# Patient Record
Sex: Male | Born: 1955 | Race: White | Hispanic: No | Smoking: Former smoker
Health system: Southern US, Community
[De-identification: ages and names within clinical notes are randomized; demographics above are authoritative.]

## PROBLEM LIST (undated history)

## (undated) DIAGNOSIS — T782XXA Anaphylactic shock, unspecified, initial encounter: Secondary | ICD-10-CM

## (undated) DIAGNOSIS — Z471 Aftercare following joint replacement surgery: Secondary | ICD-10-CM

## (undated) DIAGNOSIS — Z96641 Presence of right artificial hip joint: Secondary | ICD-10-CM

## (undated) DIAGNOSIS — T7840XA Allergy, unspecified, initial encounter: Secondary | ICD-10-CM

## (undated) DIAGNOSIS — Z8489 Family history of other specified conditions: Secondary | ICD-10-CM

## (undated) DIAGNOSIS — M1990 Unspecified osteoarthritis, unspecified site: Secondary | ICD-10-CM

## (undated) HISTORY — DX: Aftercare following joint replacement surgery: Z96.641

## (undated) HISTORY — DX: Aftercare following joint replacement surgery: Z47.1

## (undated) HISTORY — DX: Anaphylactic shock, unspecified, initial encounter: T78.2XXA

## (undated) HISTORY — DX: Allergy, unspecified, initial encounter: T78.40XA

---
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
## 2014-10-31 ENCOUNTER — Ambulatory Visit: Payer: Self-pay | Attending: Internal Medicine

## 2019-08-11 ENCOUNTER — Encounter: Payer: Self-pay | Admitting: Nurse Practitioner

## 2019-08-18 ENCOUNTER — Encounter: Payer: Self-pay | Admitting: Nurse Practitioner

## 2019-08-18 ENCOUNTER — Ambulatory Visit (INDEPENDENT_AMBULATORY_CARE_PROVIDER_SITE_OTHER): Payer: BC Managed Care – PPO | Admitting: Nurse Practitioner

## 2019-08-18 ENCOUNTER — Other Ambulatory Visit: Payer: Self-pay

## 2019-08-18 ENCOUNTER — Encounter (INDEPENDENT_AMBULATORY_CARE_PROVIDER_SITE_OTHER): Payer: Self-pay

## 2019-08-18 VITALS — BP 138/78 | HR 81 | Temp 98.6°F | Resp 20 | Ht 78.0 in | Wt 211.6 lb

## 2019-08-18 DIAGNOSIS — H9192 Unspecified hearing loss, left ear: Secondary | ICD-10-CM

## 2019-08-18 DIAGNOSIS — R350 Frequency of micturition: Secondary | ICD-10-CM

## 2019-08-18 DIAGNOSIS — Z13220 Encounter for screening for lipoid disorders: Secondary | ICD-10-CM | POA: Diagnosis not present

## 2019-08-18 DIAGNOSIS — M7061 Trochanteric bursitis, right hip: Secondary | ICD-10-CM

## 2019-08-18 DIAGNOSIS — Z23 Encounter for immunization: Secondary | ICD-10-CM

## 2019-08-18 DIAGNOSIS — Z91018 Allergy to other foods: Secondary | ICD-10-CM

## 2019-08-18 MED ORDER — EPINEPHRINE 0.3 MG/0.3ML IJ SOAJ: 0.3000 mg | INTRAMUSCULAR | 1 refills | Status: DC | PRN

## 2019-08-18 NOTE — Patient Instructions (Signed)
Stop goodies powder  Start naproxen 220 mg 1 tablet twice daily for 1 week- can use up to 2 if needed To use ice to effected area 3 times daily To use muscle rub AFTER ice   If symptoms fail to improve after 1 week or worsen to notify we will refer to orthopedics       Hip Bursitis  Hip bursitis is inflammation of a fluid-filled sac (bursa) in the hip joint. The bursa prevents the bones in the hip joint from rubbing against each other. Hip bursitis can cause mild to moderate pain, and symptoms often come and go over time. What are the causes? This condition may be caused by:  Injury to the hip.  Overuse of the muscles that surround the hip joint.  Previous injury or surgery of the hip.  Arthritis or gout.  Diabetes.  Thyroid disease.  Infection. In some cases, the cause may not be known. What are the signs or symptoms? Symptoms of this condition include:  Mild or moderate pain in the hip area. Pain may get worse with movement.  Tenderness and swelling of the hip, especially on the outer side of the hip.  In rare cases, the bursa may become infected. This may cause a fever, as well as warmth and redness in the area. Symptoms may come and go. How is this diagnosed? This condition may be diagnosed based on:  A physical exam.  Your medical history.  X-rays.  Removal of fluid from your inflamed bursa for testing (biopsy). You may be sent to a health care provider who specializes in bone diseases (orthopedist) or a provider who specializes in joint inflammation (rheumatologist). How is this treated? This condition is treated by resting, icing, applying pressure (compression), and raising (elevating) the injured area. This is called RICE treatment. In some cases, this may be enough to make your symptoms go away. Treatment may also include:  Using crutches.  Draining fluid out of the bursa to help relieve swelling.  Injecting medicine that helps to reduce  inflammation (cortisone).  Additional medicines if the bursa is infected. Follow these instructions at home: Managing pain, stiffness, and swelling   If directed, put ice on the painful area. ? Put ice in a plastic bag. ? Place a towel between your skin and the bag. ? Leave the ice on for 20 minutes, 2-3 times a day. ? Raise (elevate) your hip above the level of your heart as much as you can without pain. To do this, try putting a pillow under your hips while you lie down. Activity  Return to your normal activities as told by your health care provider. Ask your health care provider what activities are safe for you.  Rest and protect your hip as much as possible until your pain and swelling get better. General instructions  Take over-the-counter and prescription medicines only as told by your health care provider.  Wear compression wraps only as told by your health care provider.  Do not use your hip to support your body weight until your health care provider says that you can. Use crutches as told by your health care provider.  Gently massage and stretch your injured area as often as is comfortable.  Keep all follow-up visits as told by your health care provider. This is important. How is this prevented?  Exercise regularly, as told by your health care provider.  Warm up and stretch before being active.  Cool down and stretch after being active.  If  an activity irritates your hip or causes pain, avoid the activity as much as possible.  Avoid sitting down for long periods at a time. Contact a health care provider if you:  Have a fever.  Develop new symptoms.  Have difficulty walking or doing everyday activities.  Have pain that gets worse or does not get better with medicine.  Develop red skin or a feeling of warmth in your hip area. Get help right away if you:  Cannot move your hip.  Have severe pain. Summary  Hip bursitis is inflammation of a fluid-filled sac  (bursa) in the hip joint.  Hip bursitis can cause mild to moderate pain, and symptoms often come and go over time.  This condition is treated with rest, ice, compression, elevation, and medicines. This information is not intended to replace advice given to you by your health care provider. Make sure you discuss any questions you have with your health care provider. Document Released: 04/17/2002 Document Revised: 07/04/2018 Document Reviewed: 07/04/2018 Elsevier Patient Education  2020 [employer].

## 2019-08-18 NOTE — Progress Notes (Signed)
Careteam: Patient Care Team: Patient, No Pcp Per as PCP - General (General Practice)  Advanced Directive information    Allergies  Allergen Reactions  . Beef-Derived Products Anaphylaxis  . Other     Peanut Butter, Anaphylaxis     Chief Complaint  Patient presents with  . New Patient (Initial Visit)     HPI: Patient is a 63 y.o. male seen in the office today to establish care. Has not seen an provider in years. 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  His Dad was previous pt of Dr Mariea Clonts.   Had anaphylaxis after drinking a beer, having a pack of nabs and jerky. He went to the hospital and later testing came back that he was allergic to all 3. He was told to stay away from beef and peanut butter. 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   Has pain in right hip- takes goody's powder 1-2  A day- getting worse over the last 3 months. Catching pain to a constant ache. Can not sit for long or the pain worsening. 5-6/10     Review of Systems:  Review of Systems  Constitutional: Negative for chills, fever and weight loss.  HENT: Positive for tinnitus (left ear ).   Respiratory: Negative for cough, sputum production and shortness of breath.   Cardiovascular: Negative for chest pain, palpitations and leg swelling.  Gastrointestinal: Negative for abdominal pain, constipation, diarrhea and heartburn.  Genitourinary: Negative for dysuria, frequency and urgency.  Musculoskeletal: Positive for joint pain (right). Negative for back pain, falls and myalgias.  Skin: Negative.   Neurological: Negative for dizziness and headaches.  Psychiatric/Behavioral: Negative for depression and memory loss. The patient does not have insomnia.     Past Medical History:  Diagnosis Date  . Anaphylaxis    Per [facility] Patient Packet    Past Surgical History:  Procedure Laterality Date  . APPENDECTOMY  1966    Per [facility] New Patient Packet    Social History:   reports that he has quit smoking. His smoking use included cigarettes. He has a 45.00 pack-year smoking history. He has never used smokeless tobacco. He reports current alcohol use. No history on file for drug.  Family History  Problem Relation Age of Onset  . Stroke Mother     Medications: Patient's Medications  New Prescriptions   No medications on file  Previous Medications   ASPIRIN-ACETAMINOPHEN-CAFFEINE (GOODYS EXTRA STRENGTH) 500-325-65 MG PACK    Take 1-2 packets by mouth daily.  Modified Medications   No medications on file  Discontinued Medications   No medications on file    Physical Exam:  Vitals:   08/18/19 1338  BP: 138/78  Pulse: 81  Resp: 20  Temp: 98.6 F (37 C)  SpO2: 98%  Weight: 211 lb 9.6 oz (96 kg)  Height: _0  (1.981 m)   Body mass index is 24.45 kg/m. Wt Readings from Last 3 Encounters:  08/18/19 211 lb 9.6 oz (96 kg)    Physical Exam Constitutional:      General: He is not in acute distress.    Appearance: He is well-developed. He is not diaphoretic.  HENT:     Head: Normocephalic and atraumatic.     Mouth/Throat:     Pharynx: No oropharyngeal exudate.  Eyes:     Conjunctiva/sclera: Conjunctivae normal.  Pupils: Pupils are equal, round, and reactive to light.  Neck:     Musculoskeletal: Normal range of motion and neck supple.  Cardiovascular:     Rate and Rhythm: Normal rate and regular rhythm.     Heart sounds: Normal heart sounds.  Pulmonary:     Effort: Pulmonary effort is normal.     Breath sounds: Normal breath sounds.  Abdominal:     General: Bowel sounds are normal.     Palpations: Abdomen is soft.  Musculoskeletal:     Right hip: He exhibits decreased range of motion, decreased strength and tenderness.       Legs:  Skin:    General: Skin is warm and dry.  Neurological:     Mental Status: He is alert and oriented to person, place, and time.      Labs reviewed: Basic Metabolic Panel: No results for input(s): NA, K, CL, CO2, GLUCOSE, BUN, CREATININE, CALCIUM, MG, PHOS, TSH in the last 8760 hours. Liver Function Tests: No results for input(s): AST, ALT, ALKPHOS, BILITOT, PROT, ALBUMIN in the last 8760 hours. No results for input(s): LIPASE, AMYLASE in the last 8760 hours. No results for input(s): AMMONIA in the last 8760 hours. CBC: No results for input(s): WBC, NEUTROABS, HGB, HCT, MCV, PLT in the last 8760 hours. Lipid Panel: No results for input(s): CHOL, HDL, LDLCALC, TRIG, CHOLHDL, LDLDIRECT in the last 8760 hours. TSH: No results for input(s): TSH in the last 8760 hours. A1C: No results found for: HGBA1C   Assessment/Plan 1. Need for influenza vaccination - Flu Vaccine QUAD 6+ mos PF IM (Fluarix Quad PF)  2. Hearing loss of left ear, unspecified hearing loss type -ongoing, worked with heavy machines for a long time causing ringing in the ears, may want to look into hearing aids in the future but not at this time.  3. Urinary frequency -ongoing, father required TURP - PSA  4. Trochanteric bursitis of right hip Stop goodies powder  Start naproxen 220 mg 1 tablet twice daily for 1 week- can use up to 2 if needed To use ice to effected area 3 times daily To use muscle rub AFTER ice  -if fails to improve consider ortho referral for imagining/injection. - CMP with eGFR([facility])  5. Screening for lipid disorders -no diet modifications at this time - Lipid Panel - CBC with Differential/Platelet  6. Allergy to alpha-gal - EPINEPHrine 0.3 mg/0.3 mL IJ SOAJ injection; Inject 0.3 mLs (0.3 mg total) into the muscle as needed for anaphylaxis.  Dispense: 1 each; Refill: 1  Aletta Edmunds K. [place], [facility] [phone]

## 2019-08-19 LAB — LIPID PANEL
Cholesterol: 200 mg/dL — ABNORMAL HIGH (ref <200)
HDL: 37 mg/dL — ABNORMAL LOW (ref >=40)
LDL Cholesterol (Calc): 123 mg/dL (calc) — ABNORMAL HIGH
Non-HDL Cholesterol (Calc): 163 mg/dL (calc) — ABNORMAL HIGH (ref <130)
Total CHOL/HDL Ratio: 5.4 (calc) — ABNORMAL HIGH (ref <5.0)
Triglycerides: 260 mg/dL — ABNORMAL HIGH (ref <150)

## 2019-08-19 LAB — COMPLETE METABOLIC PANEL WITH GFR
AG Ratio: 1.5 (calc) (ref 1.0–2.5)
ALT: 13 U/L (ref 9–46)
AST: 13 U/L (ref 10–35)
Albumin: 4.1 g/dL (ref 3.6–5.1)
Alkaline phosphatase (APISO): 101 U/L (ref 35–144)
BUN/Creatinine Ratio: 15 (calc) (ref 6–22)
BUN: 19 mg/dL (ref 7–25)
CO2: 25 mmol/L (ref 20–32)
Calcium: 9.2 mg/dL (ref 8.6–10.3)
Chloride: 107 mmol/L (ref 98–110)
Creat: 1.29 mg/dL — ABNORMAL HIGH (ref 0.70–1.25)
GFR, Est African American: 67 mL/min/{1.73_m2} (ref >=60)
GFR, Est Non African American: 58 mL/min/{1.73_m2} — ABNORMAL LOW (ref >=60)
Globulin: 2.7 g/dL (calc) (ref 1.9–3.7)
Glucose, Bld: 87 mg/dL (ref 65–139)
Potassium: 4.3 mmol/L (ref 3.5–5.3)
Sodium: 139 mmol/L (ref 135–146)
Total Bilirubin: 0.5 mg/dL (ref 0.2–1.2)
Total Protein: 6.8 g/dL (ref 6.1–8.1)

## 2019-08-19 LAB — CBC WITH DIFFERENTIAL/PLATELET
Absolute Monocytes: 662 cells/uL (ref 200–950)
Basophils Absolute: 62 cells/uL (ref 0–200)
Basophils Relative: 0.9 %
Eosinophils Absolute: 228 cells/uL (ref 15–500)
Eosinophils Relative: 3.3 %
HCT: 40.8 % (ref 38.5–50.0)
Hemoglobin: 13.6 g/dL (ref 13.2–17.1)
Lymphs Abs: 1311 cells/uL (ref 850–3900)
MCH: 29.2 pg (ref 27.0–33.0)
MCHC: 33.3 g/dL (ref 32.0–36.0)
MCV: 87.6 fL (ref 80.0–100.0)
MPV: 12.7 fL — ABNORMAL HIGH (ref 7.5–12.5)
Monocytes Relative: 9.6 %
Neutro Abs: 4637 cells/uL (ref 1500–7800)
Neutrophils Relative %: 67.2 %
Platelets: 214 10*3/uL (ref 140–400)
RBC: 4.66 10*6/uL (ref 4.20–5.80)
RDW: 13.3 % (ref 11.0–15.0)
Total Lymphocyte: 19 %
WBC: 6.9 10*3/uL (ref 3.8–10.8)

## 2019-08-19 LAB — PSA: PSA: 3.3 ng/mL (ref <=4.0)

## 2019-08-22 ENCOUNTER — Other Ambulatory Visit: Payer: Self-pay

## 2019-08-22 DIAGNOSIS — E7800 Pure hypercholesterolemia, unspecified: Secondary | ICD-10-CM

## 2019-10-04 ENCOUNTER — Telehealth: Payer: Self-pay | Admitting: Nurse Practitioner

## 2019-10-04 ENCOUNTER — Other Ambulatory Visit: Payer: Self-pay | Admitting: Nurse Practitioner

## 2019-10-04 DIAGNOSIS — M7061 Trochanteric bursitis, right hip: Secondary | ICD-10-CM

## 2019-10-04 NOTE — Telephone Encounter (Signed)
Pt called to say that Aleve & ice is no longer helping & is ready to move to next step.  Xray? Ortho?  Please advise Thanks, Lattie Haw

## 2019-10-04 NOTE — Telephone Encounter (Signed)
Ortho referral made 

## 2019-10-04 NOTE — Telephone Encounter (Signed)
Spoke with patient and advised that referral has been made.

## 2019-10-13 DIAGNOSIS — H52223 Regular astigmatism, bilateral: Secondary | ICD-10-CM | POA: Diagnosis not present

## 2019-10-17 ENCOUNTER — Other Ambulatory Visit: Payer: Self-pay

## 2019-10-17 ENCOUNTER — Ambulatory Visit: Payer: BC Managed Care – PPO

## 2019-10-17 ENCOUNTER — Ambulatory Visit: Payer: Self-pay

## 2019-10-17 ENCOUNTER — Ambulatory Visit: Payer: BC Managed Care – PPO | Admitting: Orthopaedic Surgery

## 2019-10-17 DIAGNOSIS — M25551 Pain in right hip: Secondary | ICD-10-CM

## 2019-10-17 DIAGNOSIS — M25511 Pain in right shoulder: Secondary | ICD-10-CM | POA: Diagnosis not present

## 2019-10-17 DIAGNOSIS — M1611 Unilateral primary osteoarthritis, right hip: Secondary | ICD-10-CM | POA: Diagnosis not present

## 2019-10-17 NOTE — Progress Notes (Signed)
Office Visit Note   Patient: Zachary Best           Date of Birth: August 15, 1956           MRN: [id number] Visit Date: 10/17/2019              Requested by: [name], NP [address] [place]. [place],  [place] [address] PCP: [name], NP   Assessment & Plan: Visit Diagnoses:  1. Pain in right hip   2. Right shoulder pain, unspecified chronicity   3. Unilateral primary osteoarthritis, right hip     Plan: I went over in detail the patient's clinical exam findings with his signs and symptoms as well as x-ray findings.  From a hip standpoint on the right side my recommendation be hip replacement surgery for when this becomes definitely affecting his mobility, his actives daily living and his quality of life.  There is no joint space left so an intra-articular injection would not be useful or helpful at this standpoint.  He can take anti-inflammatories as needed for pain and occasionally offload that hip with a cane in his opposite hand.  He does have our surgery scheduler's card.  I gave him a hand about hip replacement surgery showed him a hip model explained in detail what the surgery involves from his interoperative and postoperative course.  I talked about the risk and benefits of surgery in detail.  For his right shoulder, would recommend an assessment by Dr. Prince Rome under ultrasound to consider an intra-articular glenohumeral joint injection and an AC joint injection.  I have also recommended nerve conduction studies on his bilateral upper extremities to rule out carpal tunnel syndrome and look for potentially a source of his numbness and tingling that may be neck related.  All question concerns were answered and addressed.  He would like to hold off on any intervention for now and will call us with questions or concerns as well as potentially the next steps if he gets to the point of wanting to go ahead and schedule hip replacement surgery.  I do feel it is medically clinically  warranted at this point based off his clinical exam and x-ray findings.  We can address other issues as he feels appropriate in terms of the right shoulder and bilateral hand.  Follow-Up Instructions: Return if symptoms worsen or fail to improve.   Orders:  Orders Placed This Encounter  Procedures  . XR HIP UNILAT W OR W/O PELVIS 1V RIGHT  . XR Shoulder Right   No orders of the defined types were placed in this encounter.     Procedures: No procedures performed   Clinical Data: No additional findings.   Subjective: Chief Complaint  Patient presents with  . Right Hip - Pain  . Right Shoulder - Pain  Patient is a very pleasant right-hand-dominant 63 year old gentleman who comes in for evaluation treatment of several things today.  He has been having right shoulder pain and a cracking and popping sensation that wakes him up at night.  Occasionally is burning into the shoulder.  He also has right hip pain and groin pain for several months now with no known injury.  He says is a pretty constant pain and hurts with weightbearing and it does wake him up at night as well.  He is not a diabetic and not a smoker.  He is otherwise healthy individual.  He does get numbness in both his hands as well.  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  HPI  Review of Systems He currently denies any headache, chest pain, shortness of breath, fever, chills, nausea, vomiting  Objective: Vital Signs: There were no vitals taken for this visit.  Physical Exam He is alert and orient x3 and in no acute distress Ortho Exam Examination of his right shoulder shows full range of motion with pain at the [facility] joint and the glenohumeral joint with slight grinding of the shoulder.  His rotator cuff is 5 out of 5 strength in all planes.  His liftoff is negative.  He does have stiffness with internal rotation combined with adduction.  Examination of his right hip shows significant stiffness  with attempts of internal and external rotation with severe pain in the groin.  There is some pain of the trochanteric area.  His left hip exam is entirely normal with fluid and full range of motion.  It is difficult for him to cross his right leg and put on his right shoe and socks.  Both hands have arthritic changes at the IP joints.  He has weak grip and pinch strength bilaterally and a positive Phalen's and Tinel's exam. Specialty Comments:  No specialty comments available.  Imaging: Xr Hip Unilat W Or W/o Pelvis 1v Right  Result Date: 10/17/2019 An AP pelvis and lateral right hip show severe end-stage arthritis of the right hip.  The left hip appears normal.  The right hip has complete loss of superior lateral joint space.  There are sclerotic and cystic changes as well as periarticular osteophytes.  Xr Shoulder Right  Result Date: 10/17/2019 3 views of the right shoulder show no acute findings.  There is significant arthritis of the [facility] joint and moderate arthritis of the glenohumeral joint.  There is a slight calcification of the insertion of the rotator cuff.    PMFS History: Patient Active Problem List   Diagnosis Date Noted  . Unilateral primary osteoarthritis, right hip 10/17/2019  . 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 Allergy to alpha-gal 08/18/2019   Past Medical History:  Diagnosis Date  . Allergy    seasonal  . Anaphylaxis    Per [facility] New Patient Packet     Family History  Problem Relation Age of Onset  . Stroke Mother   . Diabetes Mother        borderline  . Alzheimer's disease Father   . Cancer Sister     Past Surgical History:  Procedure Laterality Date  . APPENDECTOMY  1966   Per [facility] Patient Packet    Social History   Occupational History  . Not on file  Tobacco Use  . Smoking status: Former Smoker    Packs/day: 1.00    Years: 45.00    Pack years: 45.00    Types: Cigarettes    Quit date: 2005    Years since  quitting: 15.9  . Smokeless tobacco: Never Used  Substance and Sexual Activity  . Alcohol use: Yes    Comment: 1-2 drinks weekly - liquor  . Drug use: Not on file  . Sexual activity: Not on file

## 2019-11-22 ENCOUNTER — Other Ambulatory Visit: Payer: Self-pay

## 2019-11-29 ENCOUNTER — Other Ambulatory Visit: Payer: Self-pay

## 2019-11-29 ENCOUNTER — Other Ambulatory Visit: Payer: BC Managed Care – PPO

## 2019-11-29 DIAGNOSIS — E7800 Pure hypercholesterolemia, unspecified: Secondary | ICD-10-CM | POA: Diagnosis not present

## 2019-11-29 LAB — LIPID PANEL
Cholesterol: 199 mg/dL (ref <200)
HDL: 41 mg/dL (ref >=40)
LDL Cholesterol (Calc): 128 mg/dL (calc) — ABNORMAL HIGH
Non-HDL Cholesterol (Calc): 158 mg/dL (calc) — ABNORMAL HIGH (ref <130)
Total CHOL/HDL Ratio: 4.9 (calc) (ref <5.0)
Triglycerides: 186 mg/dL — ABNORMAL HIGH (ref <150)

## 2019-12-04 ENCOUNTER — Ambulatory Visit (INDEPENDENT_AMBULATORY_CARE_PROVIDER_SITE_OTHER): Payer: BC Managed Care – PPO | Admitting: Nurse Practitioner

## 2019-12-04 ENCOUNTER — Encounter: Payer: Self-pay | Admitting: Nurse Practitioner

## 2019-12-04 ENCOUNTER — Other Ambulatory Visit: Payer: Self-pay

## 2019-12-04 DIAGNOSIS — E785 Hyperlipidemia, unspecified: Secondary | ICD-10-CM | POA: Diagnosis not present

## 2019-12-04 DIAGNOSIS — M1611 Unilateral primary osteoarthritis, right hip: Secondary | ICD-10-CM

## 2019-12-04 NOTE — Progress Notes (Signed)
This service is provided via telemedicine  No vital signs collected/recorded due to the encounter was a telemedicine visit.   Location of patient (ex: home, work):  Home   Patient consents to a telephone visit:  Yes   Location of the provider (ex: office, home):  [facility], Office   Name of any referring provider: N/A  Names of all persons participating in the telemedicine service and their role in the encounter:  S.Chrae B/CMA, Sherrie Mustache, NP, and Patient   Time spent on call:  5 min with medical assistant      Careteam: Patient Care Team: Lauree Chandler, NP as PCP - General (Geriatric Medicine)  Advanced Directive information Does Patient Have a Medical Advance Directive?: No, Does patient want to make changes to medical advance directive?: Yes (MAU/Ambulatory/Procedural Areas - Information given)  Allergies  Allergen Reactions  . Beef-Derived Products Anaphylaxis  . Other     Peanut Butter, Anaphylaxis   . Penicillins Other (See Comments)    "childhood allergy", he doesn't know reaction     Chief Complaint  Patient presents with  . Follow-up    Discuss labs. Telehealth.   . Quality Metric Gaps    Discusss need for colonoscopy   . Best Practice Recommendations    Discuss need for HIV and Hep C screening   . Immunizations    Dicuss need for TD/TDaP      HPI: Patient is a 64 y.o. male  To discuss lipids. He had elevated cholesterol 3 months ago and made dietary modifications, not enough time to exercise but very active on the job.  Triglycerides decreased from 260 to 186 LDL 128 up from 123  Reports he continues to eat at night. Feels like he needs stop eating at night and then falling asleep. Feels like there is room to improve his diet  Had referral for ortho. Recommended total hip replacement. Plans to schedule surgery. 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 Will have down time after surgery.   Reports he is overweight and looking to change  lifestyle.    Review of Systems:  Review of Systems  Cardiovascular: Negative for chest pain.  Musculoskeletal: Positive for joint pain and myalgias.  Neurological: Negative for dizziness and headaches.    Past Medical History:  Diagnosis Date  . Allergy    seasonal  . Anaphylaxis    Per [facility] Patient Packet    Past Surgical History:  Procedure Laterality Date  . APPENDECTOMY  1966   Per [employer] [facility] New Patient Packet    Social History:   reports that he quit smoking about 16 years ago. His smoking use included cigarettes. He has a 45.00 pack-year smoking history. He has never used smokeless tobacco. He reports current alcohol use. He reports that he does not use drugs.  Family History  Problem Relation Age of Onset  . Stroke Mother   . Diabetes Mother        borderline  . Alzheimer's disease Father   . Cancer Sister     Medications: Patient's Medications  New Prescriptions   No medications on file  Previous Medications   ASPIRIN-ACETAMINOPHEN-CAFFEINE (GOODYS EXTRA STRENGTH) 500-325-65 MG PACK    Take 1 packet by mouth daily.    EPINEPHRINE 0.3 MG/0.3 ML IJ SOAJ INJECTION    Inject 0.3 mLs (0.3 mg total) into the muscle as needed for anaphylaxis.   NAPROXEN SODIUM (ALEVE) 220 MG TABLET    Take 220 mg by  mouth as needed.  Modified Medications   No medications on file  Discontinued Medications   No medications on file    Physical Exam:  There were no vitals filed for this visit. There is no height or weight on file to calculate BMI. Wt Readings from Last 3 Encounters:  08/18/19 211 lb 9.6 oz (96 kg)      Labs reviewed: Basic Metabolic Panel: Recent Labs    08/18/19 1431  NA 139  K 4.3  CL 107  CO2 25  GLUCOSE 87  BUN 19  CREATININE 1.29*  CALCIUM 9.2   Liver Function Tests: Recent Labs    08/18/19 1431  AST 13  ALT 13  BILITOT 0.5  PROT 6.8   No results for input(s): LIPASE, AMYLASE in the last 8760 hours. No  results for input(s): AMMONIA in the last 8760 hours. CBC: Recent Labs    08/18/19 1431  WBC 6.9  NEUTROABS 4,637  HGB 13.6  HCT 40.8  MCV 87.6  PLT 214   Lipid Panel: Recent Labs    08/18/19 1431 11/29/19 0811  CHOL 200* 199  HDL 37* 41  LDLCALC 123* 128*  TRIG 260* 186*  CHOLHDL 5.4* 4.9   TSH: No results for input(s): TSH in the last 8760 hours. A1C: No results found for: HGBA1C   Assessment/Plan 1. Hyperlipidemia LDL goal <100 -LDL 128, pt with family hx of stroke. He does not have htn but is overweight. Discussed need for weight loss through diet and exercise which will help reduce cardiovascular risks.  -he plans to make dietary modifications to avoid medications.  - Lipid Panel; Future - COMPLETE METABOLIC PANEL WITH GFR; Future  2. Unilateral primary osteoarthritis, right hip Following with ortho- recommended to get total hip replacement which he plans to proceed.   Next appt: 6 months with labs prior Burnadette Baskett K. Biagio Borg  [facility] [phone]    Virtual Visit via Telephone Note  I connected with pt on 12/04/19 at  8:30 AM EST by telephone and verified that I am speaking with the correct person using two identifiers.  Location: Patient: home Provider: office   I discussed the limitations, risks, security and privacy concerns of performing an evaluation and management service by telephone and the availability of in person appointments. I also discussed with the patient that there may be a patient responsible charge related to this service. The patient expressed understanding and agreed to proceed.   I discussed the assessment and treatment plan with the patient. The patient was provided an opportunity to ask questions and all were answered. The patient agreed with the plan and demonstrated an understanding of the instructions.   The patient was advised to call back or seek an in-person evaluation if the symptoms  worsen or if the condition fails to improve as anticipated.  I provided 13 minutes of non-face-to-face time during this encounter.  Janene Harvey. Biagio Borg Avs printed and mailed

## 2019-12-04 NOTE — Patient Instructions (Signed)

## 2020-03-14 DIAGNOSIS — N401 Enlarged prostate with lower urinary tract symptoms: Secondary | ICD-10-CM | POA: Diagnosis not present

## 2020-03-14 DIAGNOSIS — N138 Other obstructive and reflux uropathy: Secondary | ICD-10-CM | POA: Diagnosis not present

## 2020-05-14 DIAGNOSIS — N138 Other obstructive and reflux uropathy: Secondary | ICD-10-CM | POA: Diagnosis not present

## 2020-05-14 DIAGNOSIS — N401 Enlarged prostate with lower urinary tract symptoms: Secondary | ICD-10-CM | POA: Diagnosis not present

## 2020-05-30 ENCOUNTER — Other Ambulatory Visit: Payer: Self-pay

## 2020-05-30 ENCOUNTER — Other Ambulatory Visit: Payer: BC Managed Care – PPO

## 2020-05-30 DIAGNOSIS — E785 Hyperlipidemia, unspecified: Secondary | ICD-10-CM | POA: Diagnosis not present

## 2020-05-30 LAB — COMPLETE METABOLIC PANEL WITH GFR
AG Ratio: 1.6 (calc) (ref 1.0–2.5)
ALT: 14 U/L (ref 9–46)
AST: 14 U/L (ref 10–35)
Albumin: 4.2 g/dL (ref 3.6–5.1)
Alkaline phosphatase (APISO): 98 U/L (ref 35–144)
BUN: 18 mg/dL (ref 7–25)
CO2: 22 mmol/L (ref 20–32)
Calcium: 9.2 mg/dL (ref 8.6–10.3)
Chloride: 106 mmol/L (ref 98–110)
Creat: 1.15 mg/dL (ref 0.70–1.25)
GFR, Est African American: 77 mL/min/{1.73_m2} (ref >=60)
GFR, Est Non African American: 66 mL/min/{1.73_m2} (ref >=60)
Globulin: 2.7 g/dL (calc) (ref 1.9–3.7)
Glucose, Bld: 94 mg/dL (ref 65–99)
Potassium: 4.3 mmol/L (ref 3.5–5.3)
Sodium: 137 mmol/L (ref 135–146)
Total Bilirubin: 0.8 mg/dL (ref 0.2–1.2)
Total Protein: 6.9 g/dL (ref 6.1–8.1)

## 2020-05-30 LAB — LIPID PANEL
Cholesterol: 216 mg/dL — ABNORMAL HIGH (ref <200)
HDL: 41 mg/dL (ref >=40)
LDL Cholesterol (Calc): 148 mg/dL (calc) — ABNORMAL HIGH
Non-HDL Cholesterol (Calc): 175 mg/dL (calc) — ABNORMAL HIGH (ref <130)
Total CHOL/HDL Ratio: 5.3 (calc) — ABNORMAL HIGH (ref <5.0)
Triglycerides: 141 mg/dL (ref <150)

## 2020-06-03 ENCOUNTER — Other Ambulatory Visit: Payer: Self-pay

## 2020-06-03 ENCOUNTER — Encounter: Payer: Self-pay | Admitting: Nurse Practitioner

## 2020-06-03 ENCOUNTER — Ambulatory Visit (INDEPENDENT_AMBULATORY_CARE_PROVIDER_SITE_OTHER): Payer: BC Managed Care – PPO | Admitting: Nurse Practitioner

## 2020-06-03 VITALS — BP 134/82 | HR 84 | Temp 97.1°F | Ht 72.0 in | Wt 210.4 lb

## 2020-06-03 DIAGNOSIS — N401 Enlarged prostate with lower urinary tract symptoms: Secondary | ICD-10-CM

## 2020-06-03 DIAGNOSIS — Z114 Encounter for screening for human immunodeficiency virus [HIV]: Secondary | ICD-10-CM

## 2020-06-03 DIAGNOSIS — Z01818 Encounter for other preprocedural examination: Secondary | ICD-10-CM | POA: Diagnosis not present

## 2020-06-03 DIAGNOSIS — E785 Hyperlipidemia, unspecified: Secondary | ICD-10-CM | POA: Diagnosis not present

## 2020-06-03 DIAGNOSIS — M1611 Unilateral primary osteoarthritis, right hip: Secondary | ICD-10-CM | POA: Diagnosis not present

## 2020-06-03 DIAGNOSIS — Z1159 Encounter for screening for other viral diseases: Secondary | ICD-10-CM

## 2020-06-03 DIAGNOSIS — R350 Frequency of micturition: Secondary | ICD-10-CM

## 2020-06-03 NOTE — Progress Notes (Signed)
Careteam: Patient Care Team: [name], NP as PCP - General (Geriatric Medicine)  PLACE OF SERVICE:  [facility]  Advanced Directive information Does Patient Have a Medical Advance Directive?: No, Would patient like information on creating a medical advance directive?: Yes (MAU/Ambulatory/Procedural Areas - Information given)  Allergies  Allergen Reactions   Beef-Derived Products Anaphylaxis   Other     Peanut Butter, Anaphylaxis    Penicillins Other (See Comments)    "childhood allergy", he doesn't know reaction     Chief Complaint  Patient presents with   Medical Management of Chronic Issues    6 month follow-up and discuss labs (copy printed)      HPI: Patient is a 64 y.o. male for routine follow up.   Hyperlipidemia- LDL 148, does not wish to start medication despite elevation in LDL. Not eating healthy.   Osteoarthritis- recommended hip surgery by ortho, going tomorrow to scheduled surgery he will be 6-8 weeks out of work for recovery.   Overweight- not working out well- not interested in doing anything. Exercise is limited due to hip pain.   bph- followed by urology, has been started on finasteride but not on long enough to see a difference.   Gives blood every 56 days. Going today for donation.    Review of Systems:  Review of Systems  Constitutional: Negative for chills, fever and weight loss.  HENT: Negative for tinnitus.   Respiratory: Negative for cough, sputum production and shortness of breath.   Cardiovascular: Negative for chest pain, palpitations and leg swelling.  Gastrointestinal: Negative for abdominal pain, constipation, diarrhea and heartburn.  Genitourinary: Positive for frequency. Negative for dysuria and urgency.  Musculoskeletal: Positive for joint pain and myalgias. Negative for back pain and falls.  Skin: Negative.   Neurological: Negative for dizziness and headaches.  Psychiatric/Behavioral: Negative for depression and  memory loss. The patient does not have insomnia.     Past Medical History:  Diagnosis Date   Allergy    seasonal   Anaphylaxis    Per [facility] New Patient Packet    Past Surgical History:  Procedure Laterality Date   APPENDECTOMY  1966   Per [facility] New Patient Packet    Social History:   reports that he quit smoking about 16 years ago. His smoking use included cigarettes. He has a 45.00 pack-year smoking history. He has never used smokeless tobacco. He reports current alcohol use. He reports that he does not use drugs.  Family History  Problem Relation Age of Onset   Stroke Mother    Diabetes Mother        borderline   Alzheimer's disease Father    Cancer Sister     Medications: Patient's Medications  New Prescriptions   No medications on file  Previous Medications   ASPIRIN-ACETAMINOPHEN-CAFFEINE (GOODYS EXTRA STRENGTH) 500-325-65 MG PACK    Take 1 packet by mouth daily.    EPINEPHRINE 0.3 MG/0.3 ML IJ SOAJ INJECTION    Inject 0.3 mLs (0.3 mg total) into the muscle as needed for anaphylaxis.   FINASTERIDE (PROSCAR) 5 MG TABLET    Take 1 tablet by mouth daily. At dinnertime   NAPROXEN SODIUM (ALEVE) 220 MG TABLET    Take 220 mg by mouth as needed.  Modified Medications   No medications on file  Discontinued Medications   No medications on file    Physical Exam:  Vitals:   06/03/20 0834  BP: (!) 134/82  Pulse:  84  Temp: (!) 97.1 F (36.2 C)  TempSrc: Temporal  SpO2: 97%  Weight: (!) 210 lb 6.4 oz (95.4 kg)  Height: 6\' 6"  (1.981 m)   Body mass index is 24.31 kg/m. Wt Readings from Last 3 Encounters:  06/03/20 (!) 210 lb 6.4 oz (95.4 kg)  08/18/19 211 lb 9.6 oz (96 kg)    Physical Exam Constitutional:      General: He is not in acute distress.    Appearance: He is well-developed. He is not diaphoretic.  HENT:     Head: Normocephalic and atraumatic.     Mouth/Throat:     Pharynx: No oropharyngeal exudate.  Eyes:      Conjunctiva/sclera: Conjunctivae normal.     Pupils: Pupils are equal, round, and reactive to light.  Cardiovascular:     Rate and Rhythm: Normal rate and regular rhythm.     Heart sounds: Normal heart sounds.  Pulmonary:     Effort: Pulmonary effort is normal.     Breath sounds: Normal breath sounds.  Abdominal:     General: Bowel sounds are normal.     Palpations: Abdomen is soft.  Musculoskeletal:        General: No tenderness.     Cervical back: Normal range of motion and neck supple.  Skin:    General: Skin is warm and dry.  Neurological:     Mental Status: He is alert and oriented to person, place, and time.    Labs reviewed: Basic Metabolic Panel: Recent Labs    08/18/19 1431 05/30/20 0804  NA 139 137  K 4.3 4.3  CL 107 106  CO2 25 22  GLUCOSE 87 94  BUN 19 18  CREATININE 1.29* 1.15  CALCIUM 9.2 9.2   Liver Function Tests: Recent Labs    08/18/19 1431 05/30/20 0804  AST 13 14  ALT 13 14  BILITOT 0.5 0.8  PROT 6.8 6.9   No results for input(s): LIPASE, AMYLASE in the last 8760 hours. No results for input(s): AMMONIA in the last 8760 hours. CBC: Recent Labs    08/18/19 1431  WBC 6.9  NEUTROABS 4,637  HGB 13.6  HCT 40.8  MCV 87.6  PLT 214   Lipid Panel: Recent Labs    08/18/19 1431 11/29/19 0811 05/30/20 0804  CHOL 200* 199 216*  HDL 37* 41 41  LDLCALC 123* 128* 148*  TRIG 260* 186* 141  CHOLHDL 5.4* 4.9 5.3*   TSH: No results for input(s): TSH in the last 8760 hours. A1C: No results found for: HGBA1C   Assessment/Plan 1. Hyperlipidemia LDL goal <100 -LDL above goal. However he would like to work on diet and increasing activity as tolerates before adding medication.  - EKG 12-Lead SR rate 74  2. Unilateral primary osteoarthritis, right hip Severe pain, plans to have surgery. He is at avg risk for complication due to history. Likely will do well with surgery and rehab.   3. Benign prostatic hyperplasia with urinary frequency -has  started finasteride 5 mg daily but has not seen benefit.   4. Need for hepatitis C screening test Gives blood routinely and is screened  5. Encounter for screening for HIV Pt gives blood routinely and is screened  6. Pre-op evaluation -pt plans to have hip replacement due to severe pain. Pt with a hx of smoking but quit 16 years ago. No chest pains, shortness of breath or cough.  - EKG 12-Lead done today, SR rate 74.   Next appt: 3  months, labs prior  Janene Harvey. Biagio Borg  [facility] [phone]

## 2020-06-03 NOTE — Patient Instructions (Addendum)
Recommend to get TDAP at pharmacy- wait 2 weeks after COVID series complete  To get pneumonia 13 after COVID series- wait 2 weeks.   To complete cologuard  Follow up in 3 months    Heart-Healthy Eating Plan Many factors influence your heart (coronary) health, including eating and exercise habits. Coronary risk increases with abnormal blood fat (lipid) levels. Heart-healthy meal planning includes limiting unhealthy fats, increasing healthy fats, and making other diet and lifestyle changes.   What are tips for following this plan? Cooking Cook foods using methods other than frying. Baking, boiling, grilling, and broiling are all good options. Other ways to reduce fat include:  Removing the skin from poultry.  Removing all visible fats from meats.  Steaming vegetables in water or broth. Meal planning   At meals, imagine dividing your plate into fourths: ? Fill one-half of your plate with vegetables and green salads. ? Fill one-fourth of your plate with whole grains. ? Fill one-fourth of your plate with lean protein foods.  Eat 4-5 servings of vegetables per day. One serving equals 1 cup raw or cooked vegetable, or 2 cups raw leafy greens.  Eat 4-5 servings of fruit per day. One serving equals 1 medium whole fruit,  cup dried fruit,  cup fresh, frozen, or canned fruit, or  cup 100% fruit juice.  Eat more foods that contain soluble fiber. Examples include apples, broccoli, carrots, beans, peas, and barley. Aim to get 25-30 g of fiber per day.  Increase your consumption of legumes, nuts, and seeds to 4-5 servings per week. One serving of dried beans or legumes equals  cup cooked, 1 serving of nuts is  cup, and 1 serving of seeds equals 1 tablespoon. Fats  Choose healthy fats more often. Choose monounsaturated and polyunsaturated fats, such as olive and canola oils, flaxseeds, walnuts, almonds, and seeds.  Eat more omega-3 fats. Choose salmon, mackerel, sardines, tuna, flaxseed  oil, and ground flaxseeds. Aim to eat fish at least 2 times each week.  Check food labels carefully to identify foods with trans fats or high amounts of saturated fat.  Limit saturated fats. These are found in animal products, such as meats, butter, and cream. Plant sources of saturated fats include palm oil, palm kernel oil, and coconut oil.  Avoid foods with partially hydrogenated oils in them. These contain trans fats. Examples are stick margarine, some tub margarines, cookies, crackers, and other baked goods.  Avoid fried foods. General information  Eat more home-cooked food and less restaurant, buffet, and fast food.  Limit or avoid alcohol.  Limit foods that are high in starch and sugar.  Lose weight if you are overweight. Losing just 5-10% of your body weight can help your overall health and prevent diseases such as diabetes and heart disease.  Monitor your salt (sodium) intake, especially if you have high blood pressure. Talk with your health care provider about your sodium intake.  Try to incorporate more vegetarian meals weekly. What foods can I eat? Fruits All fresh, canned (in natural juice), or frozen fruits. Vegetables Fresh or frozen vegetables (raw, steamed, roasted, or grilled). Green salads. Grains Most grains. Choose whole wheat and whole grains most of the time. Rice and pasta, including brown rice and pastas made with whole wheat. Meats and other proteins Lean, well-trimmed beef, veal, pork, and lamb. Chicken and [place] without skin. All fish and shellfish. Wild duck, rabbit, pheasant, and venison. Egg whites or low-cholesterol egg substitutes. Dried beans, peas, lentils, and tofu. Seeds and  most nuts. Dairy Low-fat or nonfat cheeses, including ricotta and mozzarella. Skim or 1% milk (liquid, powdered, or evaporated). Buttermilk made with low-fat milk. Nonfat or low-fat yogurt. Fats and oils Non-hydrogenated (trans-free) margarines. Vegetable oils, including  soybean, sesame, sunflower, olive, peanut, safflower, corn, canola, and cottonseed. Salad dressings or mayonnaise made with a vegetable oil. Beverages Water (mineral or sparkling). Coffee and tea. Diet carbonated beverages. Sweets and desserts Sherbet, gelatin, and fruit ice. Small amounts of dark chocolate. Limit all sweets and desserts. Seasonings and condiments All seasonings and condiments. The items listed above may not be a complete list of foods and beverages you can eat. Contact a dietitian for more options. What foods are not recommended? Fruits Canned fruit in heavy syrup. Fruit in cream or butter sauce. Fried fruit. Limit coconut. Vegetables Vegetables cooked in cheese, cream, or butter sauce. Fried vegetables. Grains Breads made with saturated or trans fats, oils, or whole milk. Croissants. Sweet rolls. Donuts. High-fat crackers, such as cheese crackers. Meats and other proteins Fatty meats, such as hot dogs, ribs, sausage, bacon, rib-eye roast or steak. High-fat deli meats, such as salami and bologna. Caviar. Domestic duck and goose. Organ meats, such as liver. Dairy Cream, sour cream, cream cheese, and creamed cottage cheese. Whole milk cheeses. Whole or 2% milk (liquid, evaporated, or condensed). Whole buttermilk. Cream sauce or high-fat cheese sauce. Whole-milk yogurt. Fats and oils Meat fat, or shortening. Cocoa butter, hydrogenated oils, palm oil, coconut oil, palm kernel oil. Solid fats and shortenings, including bacon fat, salt pork, lard, and butter. Nondairy cream substitutes. Salad dressings with cheese or sour cream. Beverages Regular sodas and any drinks with added sugar. Sweets and desserts Frosting. Pudding. Cookies. Cakes. Pies. Milk chocolate or white chocolate. Buttered syrups. Full-fat ice cream or ice cream drinks. The items listed above may not be a complete list of foods and beverages to avoid. Contact a dietitian for more  information. Summary  Heart-healthy meal planning includes limiting unhealthy fats, increasing healthy fats, and making other diet and lifestyle changes.  Lose weight if you are overweight. Losing just 5-10% of your body weight can help your overall health and prevent diseases such as diabetes and heart disease.  Focus on eating a balance of foods, including fruits and vegetables, low-fat or nonfat dairy, lean protein, nuts and legumes, whole grains, and heart-healthy oils and fats. This information is not intended to replace advice given to you by your health care provider. Make sure you discuss any questions you have with your health care provider. Document Revised: 12/03/2017 Document Reviewed: 12/03/2017 Elsevier Patient Education  2020 [employer].

## 2020-06-04 ENCOUNTER — Ambulatory Visit: Payer: Self-pay

## 2020-06-04 ENCOUNTER — Ambulatory Visit: Payer: BC Managed Care – PPO | Admitting: Orthopaedic Surgery

## 2020-06-04 VITALS — Ht 72.0 in | Wt 210.0 lb

## 2020-06-04 DIAGNOSIS — M25551 Pain in right hip: Secondary | ICD-10-CM

## 2020-06-04 DIAGNOSIS — M1611 Unilateral primary osteoarthritis, right hip: Secondary | ICD-10-CM | POA: Diagnosis not present

## 2020-06-04 NOTE — Progress Notes (Deleted)
Office Visit Note   Patient: Zachary Best  Date of Birth: 20-Jul-1956           MRN: [id number] Visit Date: 06/04/2020              Requested by: [name], NP [address] [place]. [place],  [place] [address] PCP: [name], NP   Assessment & Plan: Visit Diagnoses:  1. Pain in right hip     Plan: ***  Follow-Up Instructions: No follow-ups on file.   Orders:  Orders Placed This Encounter  Procedures   XR HIP UNILAT W OR W/O PELVIS 1V RIGHT   No orders of the defined types were placed in this encounter.     Procedures: No procedures performed   Clinical Data: No additional findings.   Subjective: Chief Complaint  Patient  presents with   Right Hip - Pain    HPI  Review of Systems   Objective: Vital Signs: Ht 6' (1.829 m)    Wt (!) 210 lb (95.3 kg)    BMI 28.48 kg/m   Physical Exam  Ortho Exam  Specialty Comments:  No specialty comments available.  Imaging: No results found.   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   Allergy to alpha-gal 08/18/2019   Past Medical History:  Diagnosis Date   Allergy    seasonal   Anaphylaxis    Per [facility] New Patient Packet     Family History  Problem Relation Age of Onset   Stroke Mother    Diabetes Mother        borderline   Alzheimer's disease Father    Cancer Sister     Past Surgical History:  Procedure Laterality Date   APPENDECTOMY  1966   Per [facility] New Patient Packet    Social History   Occupational History   Not on file  Tobacco Use   Smoking status: Former Smoker    Packs/day: 1.00    Years: 45.00    Pack years: 45.00    Types: Cigarettes    Quit date: 2005    Years since quitting: 16.5   Smokeless tobacco: Never Used  Building services engineer Use: Never used  Substance and Sexual Activity   Alcohol use: Yes    Comment: 1-2 drinks weekly - liquor   Drug use: Never   Sexual activity: Not on file

## 2020-06-04 NOTE — Progress Notes (Signed)
Office Visit Note   Patient: Zachary Best           Date of Birth: 01-18-1956           MRN: [id number] Visit Date: 06/04/2020              Requested by: Zachary Seller, NP [address] [place]. [place],  [place] [address] PCP: Zachary Seller, NP   Assessment & Plan: Visit Diagnoses:  1. Pain in right hip   2. Unilateral primary osteoarthritis, right hip     Plan: I explained in detail what hip replacement surgery involves.  We talked about the risk and benefits of surgery.  I showed him hip implants and described how these are placed and how the interact with the bone in general.  We talked about his interoperative and postoperative course.  All questions and concerns were answered addressed.  We will work on getting this scheduled in the near future.  Follow-Up Instructions: Return for 2 weeks post-op.   Orders:  Orders Placed This Encounter  Procedures  . XR HIP UNILAT W OR W/O PELVIS 1V RIGHT   No orders of the defined types were placed in this encounter.     Procedures: No procedures performed   Clinical Data: No additional findings.   Subjective: Chief Complaint  Patient presents with  . Right Hip - Pain  The patient is well-known to me.  He has well-documented debilitating end-stage arthritis of his right hip.  This been getting worse for several years now.  I have seen him before.  Is been now hurting for over 4 to 5 years.  At this point his right hip pain is definitely affecting his activities day living, his quality of life and his mobility.  He walks with a significant limp.  His pain is daily and is 10 out of 10.  He reports significant right hip stiffness and groin pain.  This is also affecting his back and his right knee.  He has had no other acute change in medical status.  We have given him a handout before about hip replacement surgery.  He feels at this point that he has failed conservative treatment and does wish to proceed with hip replacement  surgery.  He has had no other acute changes in medical status  HPI  Review of Systems There is currently no headache, chest pain, shortness of breath, fever, chills, nausea, vomiting  Objective: Vital Signs: Ht 6' (1.829 m)   Wt (!) 210 lb (95.3 kg)   BMI 28.48 kg/m   Physical Exam He is alert and orient x3 and in no acute distress Ortho Exam Examination of his right hip shows almost essentially no range of motion of the hip.  There is severe pain in the groin with attempts of internal extra rotation with severe stiffness of the hip itself.  There is also a leg length discrepancy. Specialty Comments:  No specialty comments available.  Imaging: XR HIP UNILAT W OR W/O PELVIS 1V RIGHT  Result Date: 06/04/2020 An AP pelvis and lateral right hip show severe end-stage arthritis of the right hip.  This is worsened over the last several years.  There is complete loss of the joint space.  There is flattening of the femoral head.  There is para-articular osteophytes and sclerotic changes in the femoral head and acetabulum.    PMFS History: Patient Active Problem List   Diagnosis Date Noted  . Unilateral primary osteoarthritis, right hip 10/17/2019  .  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 Allergy to alpha-gal 08/18/2019   Past Medical History:  Diagnosis Date  . Allergy    seasonal  . Anaphylaxis    Per [facility] New Patient Packet     Family History  Problem Relation Age of Onset  . Stroke Mother   . Diabetes Mother        borderline  . Alzheimer's disease Father   . Cancer Sister     Past Surgical History:  Procedure Laterality Date  . APPENDECTOMY  1966   Per [facility] New Patient Packet    Social History   Occupational History  . Not on file  Tobacco Use  . Smoking status: Former Smoker    Packs/day: 1.00    Years: 45.00    Pack years: 45.00    Types: Cigarettes    Quit date: 2005    Years since quitting: 16.5  . Smokeless tobacco:  Never Used  Vaping Use  . Vaping Use: Never used  Substance and Sexual Activity  . Alcohol use: Yes    Comment: 1-2 drinks weekly - liquor  . Drug use: Never  . Sexual activity: Not on file

## 2020-06-18 ENCOUNTER — Other Ambulatory Visit: Payer: Self-pay

## 2020-06-18 ENCOUNTER — Telehealth: Payer: Self-pay

## 2020-06-18 ENCOUNTER — Encounter: Payer: Self-pay | Admitting: Nurse Practitioner

## 2020-06-18 ENCOUNTER — Ambulatory Visit (INDEPENDENT_AMBULATORY_CARE_PROVIDER_SITE_OTHER): Payer: Medicare Other | Admitting: Nurse Practitioner

## 2020-06-18 DIAGNOSIS — Z0000 Encounter for general adult medical examination without abnormal findings: Secondary | ICD-10-CM | POA: Diagnosis not present

## 2020-06-18 NOTE — Telephone Encounter (Signed)
Mr. mohsen, odenthal are scheduled for a virtual visit with your provider today.    Just as we do with appointments in the office, we must obtain your consent to participate.  Your consent will be active for this visit and any virtual visit you may have with one of our providers in the next 365 days.    If you have a MyChart account, I can also send a copy of this consent to you electronically.  All virtual visits are billed to your insurance company just like a traditional visit in the office.  As this is a virtual visit, video technology does not allow for your provider to perform a traditional examination.  This may limit your provider's ability to fully assess your condition.  If your provider identifies any concerns that need to be evaluated in person or the need to arrange testing such as labs, EKG, etc, we will make arrangements to do so.    Although advances in technology are sophisticated, we cannot ensure that it will always work on either your end or our end.  If the connection with a video visit is poor, we may have to switch to a telephone visit.  With either a video or telephone visit, we are not always able to ensure that we have a secure connection.   I need to obtain your verbal consent now.   Are you willing to proceed with your visit today?   Jayjay Littles has provided verbal consent on 06/18/2020 for a virtual visit (video or telephone).   Elveria Royals, CMA 06/18/2020  9:13 AM

## 2020-06-18 NOTE — Progress Notes (Signed)
This service is provided via telemedicine  No vital signs collected/recorded due to the encounter was a telemedicine visit.   Location of patient (ex: home, work):  Home  Patient consents to a telephone visit: Yes, see encounter dated 06/18/2020   Location of the provider (ex: office, home):  [facility]  Name of any referring provider:  N/A  Names of all persons participating in the telemedicine service and their role in the encounter:  Abbey Chatters, Nurse Practitioner, Elveria Royals, CMA, and patient.   Time spent on call:  8 minutes with medical assistant

## 2020-06-18 NOTE — Patient Instructions (Signed)
[name] , Thank you for taking time to come for your Medicare Wellness Visit. I appreciate your ongoing commitment to your health goals. Please review the following plan we discussed and let me know if I can assist you in the future.   Screening recommendations/referrals: Colonoscopy to complete cologuard Recommended yearly ophthalmology/optometry visit for glaucoma screening and checkup Recommended yearly dental visit for hygiene and checkup  Vaccinations: Influenza vaccine DUE- to get at office or local pharmacy Pneumococcal vaccine DUE- can get 2 weeks after COVID vaccine- can get in office Tdap vaccine DUE- to get after COVID vaccine- you will get this at your local pharmacy Shingles vaccine DUE- to get after COVID vaccine- you will get this at your local pharmacy    Advanced directives: to complete and bring back to office  Conditions/risks identified: -advanced age, smoking exposure  Next appointment: 1 year for AWV  Preventive Care 26 Years and Older, Male Preventive care refers to lifestyle choices and visits with your health care provider that can promote health and wellness. What does preventive care include?  A yearly physical exam. This is also called an annual well check.  Dental exams once or twice a year.  Routine eye exams. Ask your health care provider how often you should have your eyes checked.  Personal lifestyle choices, including:  Daily care of your teeth and gums.  Regular physical activity.  Eating a healthy diet.  Avoiding tobacco and drug use.  Limiting alcohol use.  Practicing safe sex.  Taking low doses of aspirin every day.  Taking vitamin and mineral supplements as recommended by your health care provider. What happens during an annual well check? The services and screenings done by your health care provider during your annual well check will depend on your age, overall health, lifestyle risk factors, and family history of  disease. Counseling  Your health care provider may ask you questions about your:  Alcohol use.  Tobacco use.  Drug use.  Emotional well-being.  Home and relationship well-being.  Sexual activity.  Eating habits.  History of falls.  Memory and ability to understand (cognition).  Work and work Astronomer. Screening  You may have the following tests or measurements:  Height, weight, and BMI.  Blood pressure.  Lipid and cholesterol levels. These may be checked every 5 years, or more frequently if you are over 85 years old.  Skin check.  Lung cancer screening. You may have this screening every year starting at age 45 if you have a 30-pack-year history of smoking and currently smoke or have quit within the past 15 years.  Fecal occult blood test (FOBT) of the stool. You may have this test every year starting at age 37.  Flexible sigmoidoscopy or colonoscopy. You may have a sigmoidoscopy every 5 years or a colonoscopy every 10 years starting at age 107.  Prostate cancer screening. Recommendations will vary depending on your family history and other risks.  Hepatitis C blood test.  Hepatitis B blood test.  Sexually transmitted disease (STD) testing.  Diabetes screening. This is done by checking your blood sugar (glucose) after you have not eaten for a while (fasting). You may have this done every 1-3 years.  Abdominal aortic aneurysm (AAA) screening. You may need this if you are a current or former smoker.  Osteoporosis. You may be screened starting at age 5 if you are at high risk. Talk with your health care provider about your test results, treatment options, and if necessary, the need for  more tests. Vaccines  Your health care provider may recommend certain vaccines, such as:  Influenza vaccine. This is recommended every year.  Tetanus, diphtheria, and acellular pertussis (Tdap, Td) vaccine. You may need a Td booster every 10 years.  Zoster vaccine. You may  need this after age 24.  Pneumococcal 13-valent conjugate (PCV13) vaccine. One dose is recommended after age 43.  Pneumococcal polysaccharide (PPSV23) vaccine. One dose is recommended after age 47. Talk to your health care provider about which screenings and vaccines you need and how often you need them. This information is not intended to replace advice given to you by your health care provider. Make sure you discuss any questions you have with your health care provider. Document Released: 11/22/2015 Document Revised: 07/15/2016 Document Reviewed: 08/27/2015 Elsevier Interactive Patient Education  2017 [place] Prevention in the Home Falls can cause injuries. They can happen to people of all ages. There are many things you can do to make your home safe and to help prevent falls. What can I do on the outside of my home?  Regularly fix the edges of walkways and driveways and fix any cracks.  Remove anything that might make you trip as you walk through a door, such as a raised step or threshold.  Trim any bushes or trees on the path to your home.  Use bright outdoor lighting.  Clear any walking paths of anything that might make someone trip, such as rocks or tools.  Regularly check to see if handrails are loose or broken. Make sure that both sides of any steps have handrails.  Any raised decks and porches should have guardrails on the edges.  Have any leaves, snow, or ice cleared regularly.  Use sand or salt on walking paths during winter.  Clean up any spills in your garage right away. This includes oil or grease spills. What can I do in the bathroom?  Use night lights.  Install grab bars by the toilet and in the tub and shower. Do not use towel bars as grab bars.  Use non-skid mats or decals in the tub or shower.  If you need to sit down in the shower, use a plastic, non-slip stool.  Keep the floor dry. Clean up any water that spills on the floor as soon as it  happens.  Remove soap buildup in the tub or shower regularly.  Attach bath mats securely with double-sided non-slip rug tape.  Do not have throw rugs and other things on the floor that can make you trip. What can I do in the bedroom?  Use night lights.  Make sure that you have a light by your bed that is easy to reach.  Do not use any sheets or blankets that are too big for your bed. They should not hang down onto the floor.  Have a firm chair that has side arms. You can use this for support while you get dressed.  Do not have throw rugs and other things on the floor that can make you trip. What can I do in the kitchen?  Clean up any spills right away.  Avoid walking on wet floors.  Keep items that you use a lot in easy-to-reach places.  If you need to reach something above you, use a strong step stool that has a grab bar.  Keep electrical cords out of the way.  Do not use floor polish or wax that makes floors slippery. If you must use wax, use non-skid  floor wax.  Do not have throw rugs and other things on the floor that can make you trip. What can I do with my stairs?  Do not leave any items on the stairs.  Make sure that there are handrails on both sides of the stairs and use them. Fix handrails that are broken or loose. Make sure that handrails are as long as the stairways.  Check any carpeting to make sure that it is firmly attached to the stairs. Fix any carpet that is loose or worn.  Avoid having throw rugs at the top or bottom of the stairs. If you do have throw rugs, attach them to the floor with carpet tape.  Make sure that you have a light switch at the top of the stairs and the bottom of the stairs. If you do not have them, ask someone to add them for you. What else can I do to help prevent falls?  Wear shoes that:  Do not have high heels.  Have rubber bottoms.  Are comfortable and fit you well.  Are closed at the toe. Do not wear sandals.  If you  use a stepladder:  Make sure that it is fully opened. Do not climb a closed stepladder.  Make sure that both sides of the stepladder are locked into place.  Ask someone to hold it for you, if possible.  Clearly mark and make sure that you can see:  Any grab bars or handrails.  First and last steps.  Where the edge of each step is.  Use tools that help you move around (mobility aids) if they are needed. These include:  Canes.  Walkers.  Scooters.  Crutches.  Turn on the lights when you go into a dark area. Replace any light bulbs as soon as they burn out.  Set up your furniture so you have a clear path. Avoid moving your furniture around.  If any of your floors are uneven, fix them.  If there are any pets around you, be aware of where they are.  Review your medicines with your doctor. Some medicines can make you feel dizzy. This can increase your chance of falling. Ask your doctor what other things that you can do to help prevent falls. This information is not intended to replace advice given to you by your health care provider. Make sure you discuss any questions you have with your health care provider. Document Released: 08/22/2009 Document Revised: 04/02/2016 Document Reviewed: 11/30/2014 Elsevier Interactive Patient Education  2017 [employer].

## 2020-06-18 NOTE — Progress Notes (Signed)
Subjective:   Zachary Best is a 64 y.o. male who presents for an Initial Medicare Annual Wellness Visit.  Review of Systems     Cardiac Risk Factors include: advanced age (>45men, >66 women);male gender;smoking/ tobacco exposure     Objective:    Today's Vitals   06/18/20 0949  PainSc: 2    There is no height or weight on file to calculate BMI.  Advanced Directives 06/03/2020 12/04/2019  Does Patient Have a Medical Advance Directive? No No  Does patient want to make changes to medical advance directive? - Yes (MAU/Ambulatory/Procedural Areas - Information given)  Would patient like information on creating a medical advance directive? Yes (MAU/Ambulatory/Procedural Areas - Information given) -    Current Medications (verified) Outpatient Encounter Medications as of 06/18/2020  Medication Sig  . Aspirin-Acetaminophen-Caffeine (GOODYS EXTRA STRENGTH) 500-325-65 MG PACK Take 1 packet by mouth daily.   Marland Kitchen EPINEPHrine 0.3 mg/0.3 mL IJ SOAJ injection Inject 0.3 mLs (0.3 mg total) into the muscle as needed for anaphylaxis.  . finasteride (PROSCAR) 5 MG tablet Take 1 tablet by mouth daily. At dinnertime  . naproxen sodium (ALEVE) 220 MG tablet Take 220 mg by mouth as needed.   No facility-administered encounter medications on file as of 06/18/2020.    Allergies (verified) Beef-derived products, Other, and Penicillins   History: Past Medical History:  Diagnosis Date  . Allergy    seasonal  . Anaphylaxis    Per [facility] New Patient Packet    Past Surgical History:  Procedure Laterality Date  . APPENDECTOMY  1966   Per [facility] New Patient Packet    Family History  Problem Relation Age of Onset  . Stroke Mother   . Diabetes Mother        borderline  . Alzheimer's disease Father   . Cancer Sister    Social History   Socioeconomic History  . Marital status: Widowed    Spouse name: Not on file  . Number of children: Not on file  . Years of  education: Not on file  . Highest education level: Not on file  Occupational History  . Not on file  Tobacco Use  . Smoking status: Former Smoker    Packs/day: 1.00    Years: 45.00    Pack years: 45.00    Types: Cigarettes    Quit date: 2005    Years since quitting: 16.6  . Smokeless tobacco: Never Used  Vaping Use  . Vaping Use: Never used  Substance and Sexual Activity  . Alcohol use: Yes    Comment: 1-2 drinks weekly - liquor  . Drug use: Never  . Sexual activity: Not on file  Other Topics Concern  . Not on file  Social History Narrative   Per [facility] New Patient Packet, abstracted 08/11/2019   Diet:      Caffeine: Coffee, 1 cup a day      Married, if yes what year:  Widowed, 1975      Do you live in a house, apartment, assisted living, condo, trailer, ect: house, 1 1/2 stories, 1 person       Pets: No      Current/Past profession: Completed High school.  Conservator, museum/gallery      Exercise: Walking when able, daily          Living Will: No   DNR: No   POA/HPOA: No      Functional Status:   Do you have difficulty  bathing or dressing yourself? No   Do you have difficulty preparing food or eating? No   Do you have difficulty managing your medications? No   Do you have difficulty managing your finances? No   Do you have difficulty affording your medications? No   Social Determinants of Health   Financial Resource Strain:   . Difficulty of Paying Living Expenses:   Food Insecurity:   . Worried About Programme researcher, broadcasting/film/videounning Out of Food in the Last Year:   . Baristaan Out of Food in the Last Year:   Transportation Needs:   . Freight forwarderLack of Transportation (Medical):   Marland Kitchen. Lack of Transportation (Non-Medical):   Physical Activity:   . Days of Exercise per Week:   . Minutes of Exercise per Session:   Stress:   . Feeling of Stress :   Social Connections:   . Frequency of Communication with Friends and Family:   . Frequency of Social Gatherings with Friends and Family:   . Attends  Religious Services:   . Active Member of Clubs or Organizations:   . Attends BankerClub or Organization Meetings:   Marland Kitchen. Marital Status:     Tobacco Counseling Counseling given: Not Answered   Clinical Intake:  Pre-visit preparation completed: Yes  Pain : 0-10 Pain Score: 2  Pain Type: Chronic pain Pain Location: Hip Pain Orientation: Right Pain Radiating Towards: down leg Pain Descriptors / Indicators: Constant, Sharp, Aching Pain Onset: More than a month ago Pain Frequency: Constant     BMI - recorded: 28 Nutritional Status: BMI 25 -29 Overweight Diabetes: No  How often do you need to have someone help you when you read instructions, pamphlets, or other written materials from your doctor or pharmacy?: 1 - Never  Diabetic?no         Activities of Daily Living In your present state of health, do you have any difficulty performing the following activities: 06/18/2020  Hearing? Y  Comment when ear is stopped up  Vision? N  Difficulty concentrating or making decisions? N  Walking or climbing stairs? N  Dressing or bathing? N  Doing errands, shopping? N  Preparing Food and eating ? N  Using the Toilet? N  In the past six months, have you accidently leaked urine? N  Do you have problems with loss of bowel control? N  Managing your Medications? N  Managing your Finances? N  Housekeeping or managing your Housekeeping? N  Some recent data might be hidden    Patient Care Team: Sharon SellerEubanks, Linford Quintela K, NP as PCP - General (Geriatric Medicine)  Indicate any recent Medical Services you may have received from other than [facility] providers in the past year (date may be approximate).     Assessment:   This is a routine wellness examination for Zachary HuaDavid.  Hearing/Vision screen  Hearing Screening   125Hz  250Hz  500Hz  1000Hz  2000Hz  3000Hz  4000Hz  6000Hz  8000Hz   Right ear:           Left ear:           Comments: Patient states that he would like to get ears checked and cleaned.  Vision  Screening Comments: Patient wears glasses. Patient has had an eye exam within last year.  Dietary issues and exercise activities discussed: Current Exercise Habits: The patient has a physically strenuous job, but has no regular exercise apart from work.  Goals   None    Depression Screen PHQ 2/9 Scores 06/18/2020 06/03/2020 12/04/2019  PHQ - 2 Score 0 0 0  Fall Risk Fall Risk  06/18/2020 06/03/2020 12/04/2019  Falls in the past year? 0 0 0  Number falls in past yr: 0 0 0  Injury with Fall? 0 0 0    Any stairs in or around the home? Yes  If so, are there any without handrails? No  Home free of loose throw rugs in walkways, pet beds, electrical cords, etc? Yes  Adequate lighting in your home to reduce risk of falls? Yes   ASSISTIVE DEVICES UTILIZED TO PREVENT FALLS:  Life alert? No  Use of a cane, walker or w/c? No  Grab bars in the bathroom? Yes  Shower chair or bench in shower? Yes  Elevated toilet seat or a handicapped toilet? No   TIMED UP AND GO: na  Cognitive Function:     6CIT Screen 06/18/2020  What Year? 0 points  What month? 0 points  What time? 0 points  Count back from 20 0 points  Months in reverse 0 points  Repeat phrase 2 points  Total Score 2    Immunizations Immunization History  Administered Date(s) Administered  . Influenza,inj,Quad PF,6+ Mos 08/18/2019  . Moderna SARS-COVID-2 Vaccination 05/20/2020    TDAP status: Due, Education has been provided regarding the importance of this vaccine. Advised may receive this vaccine at local pharmacy or Health Dept. Aware to provide a copy of the vaccination record if obtained from local pharmacy or Health Dept. Verbalized acceptance and understanding. Flu vaccine- due at this time. DUE for pneumonia vaccine  Covid-19 vaccine status: Completed vaccines as of tomorrow  Qualifies for Shingles Vaccine? Yes   Zostavax completed No   Shingrix Completed?: No.    Education has been provided regarding the  importance of this vaccine. Patient has been advised to call insurance company to determine out of pocket expense if they have not yet received this vaccine. Advised may also receive vaccine at local pharmacy or Health Dept. Verbalized acceptance and understanding.  Screening Tests Health Maintenance  Topic Date Due  . TETANUS/TDAP  Never done  . Fecal DNA (Cologuard)  Never done  . PNA vac Low Risk Adult (1 of 2 - PCV13) Never done  . COVID-19 Vaccine (2 - Moderna 2-dose series) 06/17/2020  . INFLUENZA VACCINE  06/09/2020  . Hepatitis C Screening  Completed  . HIV Screening  Completed    Health Maintenance  Health Maintenance Due  Topic Date Due  . TETANUS/TDAP  Never done  . Fecal DNA (Cologuard)  Never done  . PNA vac Low Risk Adult (1 of 2 - PCV13) Never done  . COVID-19 Vaccine (2 - Moderna 2-dose series) 06/17/2020  . INFLUENZA VACCINE  06/09/2020    Colorectal screening- plans to complete cologuard   Lung Cancer Screening: (Low Dose CT Chest recommended if Age 53-80 years, 30 pack-year currently smoking OR have quit w/in 15years.) does not qualify.   Lung Cancer Screening Referral:na  Additional Screening:  Hepatitis C Screening: does qualify; Completed   Vision Screening: Recommended annual ophthalmology exams for early detection of glaucoma and other disorders of the eye. Is the patient up to date with their annual eye exam?  Yes  Who is the provider or what is the name of the office in which the patient attends annual eye exams? [facility] If pt is not established with a provider, would they like to be referred to a provider to establish care? No .   Dental Screening: Recommended annual dental exams for proper oral hygiene  Community  Resource Referral / Chronic Care Management: CRR required this visit?  No   CCM required this visit?  No      Plan:     I have personally reviewed and noted the following in the patient's chart:   . Medical and  social history . Use of alcohol, tobacco or illicit drugs  . Current medications and supplements . Functional ability and status . Nutritional status . Physical activity . Advanced directives . List of other physicians . Hospitalizations, surgeries, and ER visits in previous 12 months . Vitals . Screenings to include cognitive, depression, and falls . Referrals and appointments  In addition, I have reviewed and discussed with patient certain preventive protocols, quality metrics, and best practice recommendations. A written personalized care plan for preventive services as well as general preventive health recommendations were provided to patient.     [name], NP   06/18/2020

## 2020-06-19 ENCOUNTER — Ambulatory Visit: Payer: Medicare Other | Admitting: Nurse Practitioner

## 2020-06-19 ENCOUNTER — Encounter: Payer: BC Managed Care – PPO | Admitting: Nurse Practitioner

## 2020-06-20 ENCOUNTER — Other Ambulatory Visit: Payer: Self-pay

## 2020-06-24 ENCOUNTER — Telehealth: Payer: Self-pay

## 2020-06-24 NOTE — Telephone Encounter (Signed)
I called and spoke with patient to see if he would be using and sending back the Cologuard kit he received and he said yes he would that he had  Received it but just hadn't used it yet

## 2020-07-01 NOTE — Progress Notes (Signed)
Pt. Needs orders for the upcomming surgery.PST appointment on 07/02/20.Thanks.

## 2020-07-01 NOTE — Patient Instructions (Signed)
DUE TO COVID-19 ONLY ONE VISITOR IS ALLOWED TO COME WITH YOU AND STAY IN THE WAITING ROOM ONLY DURING PRE OP AND PROCEDURE DAY OF SURGERY. THE 1 VISITOR  MAY VISIT WITH YOU AFTER SURGERY IN YOUR PRIVATE ROOM DURING VISITING HOURS ONLY!  YOU NEED TO HAVE A COVID 19 TEST ON: 07/23/20 @            , THIS TEST MUST BE DONE BEFORE SURGERY,  COVID TESTING SITE [address] [place] [address], IT IS ON THE RIGHT GOING OUT [address] APPROXIMATELY  2 MINUTES PAST [employer] ON THE RIGHT. ONCE YOUR COVID TEST IS COMPLETED,  PLEASE BEGIN THE QUARANTINE INSTRUCTIONS AS OUTLINED IN YOUR HANDOUT.                Zachary Best   Your procedure is scheduled on: 07/26/20   Report to [facility] Main  Entrance   Report to admitting at: 5:30 AM     Call this number if you have problems the morning of surgery [phone]    Remember: Do not eat food or drink liquids :After Midnight.   BRUSH YOUR TEETH MORNING OF SURGERY AND RINSE YOUR MOUTH OUT, NO CHEWING GUM CANDY OR MINTS    You may not have any metal on your body including hair pins and              piercings  Do not wear jewelry, lotions, powders or perfumes, deodorant             Men may shave face and neck.   Do not bring valuables to the hospital. [facility] IS NOT             RESPONSIBLE   FOR VALUABLES.  Contacts, dentures or bridgework may not be worn into surgery.  Leave suitcase in the car. After surgery it may be brought to your room.     Patients discharged the day of surgery will not be allowed to drive home. IF YOU ARE HAVING SURGERY AND GOING HOME THE SAME DAY, YOU MUST HAVE AN ADULT TO DRIVE YOU HOME AND BE WITH YOU FOR 24 HOURS. YOU MAY GO HOME BY TAXI OR UBER OR ORTHERWISE, BUT AN ADULT MUST ACCOMPANY YOU HOME AND STAY WITH YOU FOR 24 HOURS.  Name and phone number of your driver:  Special Instructions: N/A              Please read over the following fact sheets you were  given: _____________________________________________________________________         [facility] - Preparing for Surgery Before surgery, you can play an important role.  Because skin is not sterile, your skin needs to be as free of germs as possible.  You can reduce the number of germs on your skin by washing with CHG (chlorahexidine gluconate) soap before surgery.  CHG is an antiseptic cleaner which kills germs and bonds with the skin to continue killing germs even after washing. Please DO NOT use if you have an allergy to CHG or antibacterial soaps.  If your skin becomes reddened/irritated stop using the CHG and inform your nurse when you arrive at Short Stay. Do not shave (including legs and underarms) for at least 48 hours prior to the first CHG shower.  You may shave your face/neck. Please follow these instructions carefully:  1.  Shower with CHG Soap the night before surgery and the  morning of Surgery.  2.  If you choose  to wash your hair, wash your hair first as usual with your  normal  shampoo.  3.  After you shampoo, rinse your hair and body thoroughly to remove the  shampoo.                           4.  Use CHG as you would any other liquid soap.  You can apply chg directly  to the skin and wash                       Gently with a scrungie or clean washcloth.  5.  Apply the CHG Soap to your body ONLY FROM THE NECK DOWN.   Do not use on face/ open                           Wound or open sores. Avoid contact with eyes, ears mouth and genitals (private parts).                       Wash face,  Genitals (private parts) with your normal soap.             6.  Wash thoroughly, paying special attention to the area where your surgery  will be performed.  7.  Thoroughly rinse your body with warm water from the neck down.  8.  DO NOT shower/wash with your normal soap after using and rinsing off  the CHG Soap.                9.  Pat yourself dry with a clean towel.            10.  Wear clean  pajamas.            11.  Place clean sheets on your bed the night of your first shower and do not  sleep with pets. Day of Surgery : Do not apply any lotions/deodorants the morning of surgery.  Please wear clean clothes to the hospital/surgery center.  FAILURE TO FOLLOW THESE INSTRUCTIONS MAY RESULT IN THE CANCELLATION OF YOUR SURGERY PATIENT SIGNATURE_________________________________  NURSE SIGNATURE__________________________________  ________________________________________________________________________

## 2020-07-02 ENCOUNTER — Other Ambulatory Visit (HOSPITAL_COMMUNITY): Payer: BC Managed Care – PPO

## 2020-07-02 ENCOUNTER — Encounter (HOSPITAL_COMMUNITY)
Admission: RE | Admit: 2020-07-02 | Discharge: 2020-07-02 | Disposition: A | Discharge status: Home / Self Care | Payer: Medicare Other | Source: Ambulatory Visit | Attending: Orthopaedic Surgery | Admitting: Orthopaedic Surgery

## 2020-07-09 LAB — COLOGUARD
COLOGUARD: NEGATIVE
Cologuard: NEGATIVE

## 2020-07-11 ENCOUNTER — Telehealth: Payer: Self-pay | Admitting: Family

## 2020-07-11 NOTE — Telephone Encounter (Signed)
coloGuard results received are normal.Repeat Cologuard screening in 3 years.

## 2020-07-11 NOTE — Telephone Encounter (Signed)
Called and spoke with patient about his Cologuard results and he had no question

## 2020-07-17 ENCOUNTER — Other Ambulatory Visit: Payer: Self-pay | Admitting: Physician Assistant

## 2020-07-18 ENCOUNTER — Inpatient Hospital Stay: Payer: BC Managed Care – PPO | Admitting: Orthopaedic Surgery

## 2020-07-22 NOTE — Patient Instructions (Signed)
DUE TO COVID-19 ONLY ONE VISITOR IS ALLOWED TO COME WITH YOU AND STAY IN THE WAITING ROOM ONLY DURING PRE OP AND PROCEDURE DAY OF SURGERY. THE 1 VISITOR  MAY VISIT WITH YOU AFTER SURGERY IN YOUR PRIVATE ROOM DURING VISITING HOURS ONLY!  YOU NEED TO HAVE A COVID 19 TEST ON: 07/23/20 @ 11:25 am , THIS TEST MUST BE DONE BEFORE SURGERY,  COVID TESTING SITE [address] [place] [address], IT IS ON THE RIGHT GOING OUT [address] APPROXIMATELY  2 MINUTES PAST [employer] ON THE RIGHT. ONCE YOUR COVID TEST IS COMPLETED,  PLEASE BEGIN THE QUARANTINE INSTRUCTIONS AS OUTLINED IN YOUR HANDOUT.                Zachary Best    Your procedure is scheduled on: 07/26/20   Report to [facility] Main  Entrance   Report to short stay at: 5:30 AM     Call this number if you have problems the morning of surgery [phone]    Remember:   NO SOLID FOOD AFTER MIDNIGHT THE NIGHT PRIOR TO SURGERY. NOTHING BY MOUTH EXCEPT CLEAR LIQUIDS UNTIL: 4:15 AM . PLEASE FINISH ENSURE DRINK PER SURGEON ORDER  WHICH NEEDS TO BE COMPLETED AT: 4:15 AM .  CLEAR LIQUID DIET   Foods Allowed                                                                     Foods Excluded  Coffee and tea, regular and decaf                             liquids that you cannot  Plain Jell-O any favor except red or purple                                           see through such as: Fruit ices (not with fruit pulp)                                     milk, soups, orange juice  Iced Popsicles                                    All solid food Carbonated beverages, regular and diet                                    Cranberry, grape and apple juices Sports drinks like Gatorade Lightly seasoned clear broth or consume(fat free) Sugar, honey syrup  Sample Menu Breakfast                                Lunch  Supper Cranberry juice                    Beef broth                             Chicken broth Jell-O                                     Grape juice                           Apple juice Coffee or tea                        Jell-O                                      Popsicle                                                Coffee or tea                        Coffee or tea  _____________________________________________________________________   BRUSH YOUR TEETH MORNING OF SURGERY AND RINSE YOUR MOUTH OUT, NO CHEWING GUM CANDY OR MINTS.                                 You may not have any metal on your body including hair pins and              piercings  Do not wear jewelry, lotions, powders or perfumes, deodorant             Men may shave face and neck.   Do not bring valuables to the hospital. [facility] IS NOT             RESPONSIBLE   FOR VALUABLES.  Contacts, dentures or bridgework may not be worn into surgery.  Leave suitcase in the car. After surgery it may be brought to your room.     Patients discharged the day of surgery will not be allowed to drive home. IF YOU ARE HAVING SURGERY AND GOING HOME THE SAME DAY, YOU MUST HAVE AN ADULT TO DRIVE YOU HOME AND BE WITH YOU FOR 24 HOURS. YOU MAY GO HOME BY TAXI OR UBER OR ORTHERWISE, BUT AN ADULT MUST ACCOMPANY YOU HOME AND STAY WITH YOU FOR 24 HOURS.  Name and phone number of your driver:  Special Instructions: N/A              Please read over the following fact sheets you were given: _____________________________________________________________________          [facility] - Preparing for Surgery Before surgery, you can play an important role.  Because skin is not sterile, your skin needs to be as free of germs as possible.  You can reduce the number of germs on your skin by washing with CHG (chlorahexidine gluconate) soap before surgery.  CHG is an antiseptic cleaner which kills germs and  bonds with the skin to continue killing germs even after washing. Please DO NOT use if you have an allergy to CHG or  antibacterial soaps.  If your skin becomes reddened/irritated stop using the CHG and inform your nurse when you arrive at Short Stay. Do not shave (including legs and underarms) for at least 48 hours prior to the first CHG shower.  You may shave your face/neck. Please follow these instructions carefully:  1.  Shower with CHG Soap the night before surgery and the  morning of Surgery.  2.  If you choose to wash your hair, wash your hair first as usual with your  normal  shampoo.  3.  After you shampoo, rinse your hair and body thoroughly to remove the  shampoo.                           4.  Use CHG as you would any other liquid soap.  You can apply chg directly  to the skin and wash                       Gently with a scrungie or clean washcloth.  5.  Apply the CHG Soap to your body ONLY FROM THE NECK DOWN.   Do not use on face/ open                           Wound or open sores. Avoid contact with eyes, ears mouth and genitals (private parts).                       Wash face,  Genitals (private parts) with your normal soap.             6.  Wash thoroughly, paying special attention to the area where your surgery  will be performed.  7.  Thoroughly rinse your body with warm water from the neck down.  8.  DO NOT shower/wash with your normal soap after using and rinsing off  the CHG Soap.                9.  Pat yourself dry with a clean towel.            10.  Wear clean pajamas.            11.  Place clean sheets on your bed the night of your first shower and do not  sleep with pets. Day of Surgery : Do not apply any lotions/deodorants the morning of surgery.  Please wear clean clothes to the hospital/surgery center.  FAILURE TO FOLLOW THESE INSTRUCTIONS MAY RESULT IN THE CANCELLATION OF YOUR SURGERY PATIENT SIGNATURE_________________________________  NURSE SIGNATURE__________________________________  ________________________________________________________________________   Zachary Best  An incentive spirometer is a tool that can help keep your lungs clear and active. This tool measures how well you are filling your lungs with each breath. Taking long deep breaths may help reverse or decrease the chance of developing breathing (pulmonary) problems (especially infection) following:  A long period of time when you are unable to move or be active. BEFORE THE PROCEDURE   If the spirometer includes an indicator to show your best effort, your nurse or respiratory therapist will set it to a desired goal.  If possible, sit up straight or lean slightly forward. Try not to slouch.  Hold the incentive spirometer in an upright position.  INSTRUCTIONS FOR USE  1. Sit on the edge of your bed if possible, or sit up as far as you can in bed or on a chair. 2. Hold the incentive spirometer in an upright position. 3. Breathe out normally. 4. Place the mouthpiece in your mouth and seal your lips tightly around it. 5. Breathe in slowly and as deeply as possible, raising the piston or the ball toward the top of the column. 6. Hold your breath for 3-5 seconds or for as long as possible. Allow the piston or ball to fall to the bottom of the column. 7. Remove the mouthpiece from your mouth and breathe out normally. 8. Rest for a few seconds and repeat Steps 1 through 7 at least 10 times every 1-2 hours when you are awake. Take your time and take a few normal breaths between deep breaths. 9. The spirometer may include an indicator to show your best effort. Use the indicator as a goal to work toward during each repetition. 10. After each set of 10 deep breaths, practice coughing to be sure your lungs are clear. If you have an incision (the cut made at the time of surgery), support your incision when coughing by placing a pillow or rolled up towels firmly against it. Once you are able to get out of bed, walk around indoors and cough well. You may stop using the incentive spirometer when  instructed by your caregiver.  RISKS AND COMPLICATIONS  Take your time so you do not get dizzy or light-headed.  If you are in pain, you may need to take or ask for pain medication before doing incentive spirometry. It is harder to take a deep breath if you are having pain. AFTER USE  Rest and breathe slowly and easily.  It can be helpful to keep track of a log of your progress. Your caregiver can provide you with a simple table to help with this. If you are using the spirometer at home, follow these instructions: SEEK MEDICAL CARE IF:   You are having difficultly using the spirometer.  You have trouble using the spirometer as often as instructed.  Your pain medication is not giving enough relief while using the spirometer.  You develop fever of 100.5 F (38.1 C) or higher. SEEK IMMEDIATE MEDICAL CARE IF:   You cough up bloody sputum that had not been present before.  You develop fever of 102 F (38.9 C) or greater.  You develop worsening pain at or near the incision site. MAKE SURE YOU:   Understand these instructions.  Will watch your condition.  Will get help right away if you are not doing well or get worse. Document Released: 03/08/2007 Document Revised: 01/18/2012 Document Reviewed: 05/09/2007 [facility] Patient Information 2014 [place], [place].   ________________________________________________________________________

## 2020-07-23 ENCOUNTER — Other Ambulatory Visit: Payer: Self-pay

## 2020-07-23 ENCOUNTER — Other Ambulatory Visit (HOSPITAL_COMMUNITY)
Admission: RE | Admit: 2020-07-23 | Discharge: 2020-07-23 | Disposition: A | Discharge status: Home / Self Care | Payer: Medicare Other | Source: Ambulatory Visit | Attending: Orthopaedic Surgery | Admitting: Orthopaedic Surgery

## 2020-07-23 ENCOUNTER — Encounter (HOSPITAL_COMMUNITY)
Admission: RE | Admit: 2020-07-23 | Discharge: 2020-07-23 | Disposition: A | Discharge status: Home / Self Care | Payer: Medicare Other | Source: Ambulatory Visit | Attending: Orthopaedic Surgery | Admitting: Orthopaedic Surgery

## 2020-07-23 ENCOUNTER — Other Ambulatory Visit (HOSPITAL_COMMUNITY): Payer: Medicare Other

## 2020-07-23 ENCOUNTER — Encounter (HOSPITAL_COMMUNITY): Payer: BC Managed Care – PPO

## 2020-07-23 ENCOUNTER — Other Ambulatory Visit (HOSPITAL_COMMUNITY): Payer: BC Managed Care – PPO

## 2020-07-23 ENCOUNTER — Encounter (HOSPITAL_COMMUNITY): Payer: Self-pay

## 2020-07-23 DIAGNOSIS — Z20822 Contact with and (suspected) exposure to covid-19: Secondary | ICD-10-CM | POA: Diagnosis not present

## 2020-07-23 DIAGNOSIS — Z01812 Encounter for preprocedural laboratory examination: Secondary | ICD-10-CM | POA: Insufficient documentation

## 2020-07-23 HISTORY — DX: Family history of other specified conditions: Z84.89

## 2020-07-23 HISTORY — DX: Unspecified osteoarthritis, unspecified site: M19.90

## 2020-07-23 LAB — CBC
HCT: 41.9 % (ref 39.0–52.0)
Hemoglobin: 13.8 g/dL (ref 13.0–17.0)
MCH: 30 pg (ref 26.0–34.0)
MCHC: 32.9 g/dL (ref 30.0–36.0)
MCV: 91.1 fL (ref 80.0–100.0)
Platelets: 177 10*3/uL (ref 150–400)
RBC: 4.6 MIL/uL (ref 4.22–5.81)
RDW: 14 % (ref 11.5–15.5)
WBC: 6.8 10*3/uL (ref 4.0–10.5)
nRBC: 0 % (ref 0.0–0.2)

## 2020-07-23 LAB — SURGICAL PCR SCREEN
MRSA, PCR: NEGATIVE
Staphylococcus aureus: NEGATIVE

## 2020-07-23 NOTE — Progress Notes (Signed)
COVID Vaccine Completed: Yes Date COVID Vaccine completed:05/20/20 COVID vaccine manufacturer:     Moderna     PCP - Ruben Gottron: NP. LOV: 06/18/20  Cardiologist - NO  Chest x-ray -  EKG -  Stress Test -  ECHO -  Cardiac Cath -  Pacemaker/ICD device last checked:  Sleep Study -  CPAP -   Fasting Blood Sugar -  Checks Blood Sugar _____ times a day  Blood Thinner Instructions: Aspirin Instructions: Last Dose:  Anesthesia review:   Patient denies shortness of breath, fever, cough and chest pain at PAT appointment   Patient verbalized understanding of instructions that were given to them at the PAT appointment. Patient was also instructed that they will need to review over the PAT instructions again at home before surgery.

## 2020-07-24 LAB — SARS CORONAVIRUS 2 (TAT 6-24 HRS): SARS Coronavirus 2: NEGATIVE

## 2020-07-25 ENCOUNTER — Other Ambulatory Visit: Payer: Self-pay | Admitting: Orthopaedic Surgery

## 2020-07-25 ENCOUNTER — Telehealth: Payer: Self-pay | Admitting: *Deleted

## 2020-07-25 MED ORDER — OXYCODONE HCL 5 MG PO TABS: 5.0000 mg | ORAL_TABLET | Freq: Four times a day (QID) | ORAL | 0 refills | Status: DC | PRN

## 2020-07-25 MED ORDER — ASPIRIN 81 MG PO CHEW: 81.0000 mg | CHEWABLE_TABLET | Freq: Two times a day (BID) | ORAL | 0 refills | Status: DC

## 2020-07-25 MED ORDER — METHOCARBAMOL 500 MG PO TABS: 500.0000 mg | ORAL_TABLET | Freq: Four times a day (QID) | ORAL | 1 refills | Status: DC | PRN

## 2020-07-25 MED ORDER — ONDANSETRON 4 MG PO TBDP: 4.0000 mg | ORAL_TABLET | Freq: Three times a day (TID) | ORAL | 0 refills | Status: DC | PRN

## 2020-07-25 NOTE — Telephone Encounter (Signed)
[facility] RNCM call to patient to discuss his Right total hip arthroplasty with Dr. Magnus Ivan on Friday, 07/26/20. He is aware that his surgery will be ambulatory/same day discharge. Patient has a daughter that will be assisting at home after discharge. He has a FWW as well as a 3in1/BSC. No DME needs. Anticipate HHPT will be needed after discharge. Choice provided and referral made to [facility]. Reviewed all post-op instructions and will be available for post op needs. Please call Ralph Dowdy, RN Case Manager for questions/concerns. [phone].

## 2020-07-25 NOTE — Anesthesia Preprocedure Evaluation (Addendum)
Anesthesia Evaluation  Patient identified by MRN, date of birth, ID band Patient awake    Reviewed: Allergy & Precautions, NPO status , Patient's Chart, lab work & pertinent test results  History of Anesthesia Complications (+) Family history of anesthesia reaction and history of anesthetic complications (father and sister with pseudocholinesterase deficiency)  Airway Mallampati: I  TM Distance: >3 FB Neck ROM: Full    Dental  (+) Caps, Dental Advisory Given   Pulmonary former smoker,  07/23/2020 SARS coronavirus NEG   breath sounds clear to auscultation       Cardiovascular negative cardio ROS   Rhythm:Regular Rate:Normal     Neuro/Psych negative neurological ROS     GI/Hepatic negative GI ROS, Neg liver ROS,   Endo/Other  negative endocrine ROS  Renal/GU negative Renal ROS     Musculoskeletal  (+) Arthritis , Osteoarthritis,    Abdominal   Peds  Hematology negative hematology ROS (+)   Anesthesia Other Findings   Reproductive/Obstetrics                            Anesthesia Physical Anesthesia Plan  ASA: II  Anesthesia Plan: Spinal   Post-op Pain Management:    Induction:   PONV Risk Score and Plan: 1 and Dexamethasone, Ondansetron and Treatment may vary due to age or medical condition  Airway Management Planned: Natural Airway and Simple Face Mask  Additional Equipment: None  Intra-op Plan:   Post-operative Plan:   Informed Consent: I have reviewed the patients History and Physical, chart, labs and discussed the procedure including the risks, benefits and alternatives for the proposed anesthesia with the patient or authorized representative who has indicated his/her understanding and acceptance.     Dental advisory given  Plan Discussed with: CRNA and Surgeon  Anesthesia Plan Comments:        Anesthesia Quick Evaluation

## 2020-07-25 NOTE — H&P (Signed)
TOTAL HIP ADMISSION H&P  Patient is admitted for right total hip arthroplasty.  Subjective:  Chief Complaint: right hip pain  HPI: Zachary Best, 64 y.o. male, has a history of pain and functional disability in the right hip(s) due to arthritis and patient has failed non-surgical conservative treatments for greater than 12 weeks to include NSAID's and/or analgesics, corticosteriod injections, flexibility and strengthening excercises and activity modification.  Onset of symptoms was gradual starting 5 years ago with gradually worsening course since that time.The patient noted no past surgery on the right hip(s).  Patient currently rates pain in the right hip at 10 out of 10 with activity. Patient has night pain, worsening of pain with activity and weight bearing, pain that interfers with activities of daily living and pain with passive range of motion. Patient has evidence of subchondral cysts, subchondral sclerosis, periarticular osteophytes and joint space narrowing by imaging studies. This condition presents safety issues increasing the risk of falls.  There is no current active infection.  Patient Active Problem List   Diagnosis Date Noted  . Unilateral primary osteoarthritis, right hip 10/17/2019  . 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 Allergy to alpha-gal 08/18/2019   Past Medical History:  Diagnosis Date  . Allergy    seasonal  . Anaphylaxis    Per [facility] New Patient Packet   . Arthritis   . Family history of adverse reaction to anesthesia     Past Surgical History:  Procedure Laterality Date  . APPENDECTOMY  1966   Per [facility] New Patient Packet     No current facility-administered medications for this encounter.   Current Outpatient Medications  Medication Sig Dispense Refill Last Dose  . Aspirin-Acetaminophen-Caffeine (GOODYS EXTRA STRENGTH) 500-325-65 MG PACK Take 1 packet by mouth in the morning and at bedtime.      Marland Kitchen EPINEPHrine 0.3 mg/0.3  mL IJ SOAJ injection Inject 0.3 mLs (0.3 mg total) into the muscle as needed for anaphylaxis. 1 each 1   . finasteride (PROSCAR) 5 MG tablet Take 5 mg by mouth daily with supper.      . naproxen sodium (ALEVE) 220 MG tablet Take 220 mg by mouth in the morning.      Marland Kitchen aspirin (ASPIRIN 81) 81 MG chewable tablet Chew 1 tablet (81 mg total) by mouth 2 (two) times daily after a meal. 30 tablet 0   . methocarbamol (ROBAXIN) 500 MG tablet Take 1 tablet (500 mg total) by mouth every 6 (six) hours as needed for muscle spasms. 40 tablet 1   . ondansetron (ZOFRAN ODT) 4 MG disintegrating tablet Take 1 tablet (4 mg total) by mouth every 8 (eight) hours as needed for nausea or vomiting. 20 tablet 0   . oxyCODONE (ROXICODONE) 5 MG immediate release tablet Take 1-2 tablets (5-10 mg total) by mouth every 6 (six) hours as needed for severe pain. 30 tablet 0    Allergies  Allergen Reactions  . Beef-Derived Products Anaphylaxis  . Other     Peanut Butter, Anaphylaxis   . Penicillins Other (See Comments)    "childhood allergy", he doesn't know reaction     Social History   Tobacco Use  . Smoking status: Former Smoker    Packs/day: 1.00    Years: 45.00    Pack years: 45.00    Types: Cigarettes    Quit date: 2005    Years since quitting: 16.7  . Smokeless tobacco: Never Used  Substance Use Topics  .  Alcohol use: Yes    Comment: 1-2 drinks weekly - liquor    Family History  Problem Relation Age of Onset  . Stroke Mother   . Diabetes Mother        borderline  . Alzheimer's disease Father   . Cancer Sister      Review of Systems  All other systems reviewed and are negative.   Objective:  Physical Exam Vitals reviewed.  Constitutional:      Appearance: Normal appearance.  HENT:     Head: Normocephalic and atraumatic.  Eyes:     Extraocular Movements: Extraocular movements intact.  Cardiovascular:     Rate and Rhythm: Normal rate.  Pulmonary:     Effort: Pulmonary effort is normal.   Abdominal:     Palpations: Abdomen is soft.  Musculoskeletal:     Cervical back: Normal range of motion.     Right hip: Tenderness and bony tenderness present. Decreased range of motion. Decreased strength.  Neurological:     Mental Status: He is alert and oriented to person, place, and time.     Vital signs in last 24 hours:    Labs:   Estimated body mass index is 27.4 kg/m as calculated from the following:   Height as of 07/23/20: 6' (1.829 m).   Weight as of 07/23/20: 91.6 kg.   Imaging Review Plain radiographs demonstrate severe degenerative joint disease of the right hip(s). The bone quality appears to be excellent for age and reported activity level.      Assessment/Plan:  End stage arthritis, right hip(s)  The patient history, physical examination, clinical judgement of the provider and imaging studies are consistent with end stage degenerative joint disease of the right hip(s) and total hip arthroplasty is deemed medically necessary. The treatment options including medical management, injection therapy, arthroscopy and arthroplasty were discussed at length. The risks and benefits of total hip arthroplasty were presented and reviewed. The risks due to aseptic loosening, infection, stiffness, dislocation/subluxation,  thromboembolic complications and other imponderables were discussed.  The patient acknowledged the explanation, agreed to proceed with the plan and consent was signed. Patient is being admitted for inpatient treatment for surgery, pain control, PT, OT, prophylactic antibiotics, VTE prophylaxis, progressive ambulation and ADL's and discharge planning.The patient is planning to be discharged home with home health services

## 2020-07-26 ENCOUNTER — Other Ambulatory Visit: Payer: Self-pay

## 2020-07-26 ENCOUNTER — Ambulatory Visit (HOSPITAL_COMMUNITY)
Admission: RE | Admit: 2020-07-26 | Discharge: 2020-07-26 | Disposition: A | Discharge status: Home / Self Care | Payer: Medicare Other | Attending: Orthopaedic Surgery | Admitting: Orthopaedic Surgery

## 2020-07-26 ENCOUNTER — Ambulatory Visit (HOSPITAL_COMMUNITY): Payer: Medicare Other | Admitting: Anesthesiology

## 2020-07-26 ENCOUNTER — Ambulatory Visit (HOSPITAL_COMMUNITY): Payer: Medicare Other

## 2020-07-26 ENCOUNTER — Encounter (HOSPITAL_COMMUNITY): Payer: Self-pay | Admitting: Orthopaedic Surgery

## 2020-07-26 ENCOUNTER — Encounter (HOSPITAL_COMMUNITY)
Admission: RE | Disposition: A | Discharge status: Home / Self Care | Payer: Self-pay | Source: Home / Self Care | Attending: Orthopaedic Surgery

## 2020-07-26 DIAGNOSIS — Z87892 Personal history of anaphylaxis: Secondary | ICD-10-CM | POA: Diagnosis not present

## 2020-07-26 DIAGNOSIS — Z880 Allergy status to penicillin: Secondary | ICD-10-CM | POA: Diagnosis not present

## 2020-07-26 DIAGNOSIS — M1611 Unilateral primary osteoarthritis, right hip: Secondary | ICD-10-CM | POA: Diagnosis present

## 2020-07-26 DIAGNOSIS — Z79899 Other long term (current) drug therapy: Secondary | ICD-10-CM | POA: Insufficient documentation

## 2020-07-26 DIAGNOSIS — Z7982 Long term (current) use of aspirin: Secondary | ICD-10-CM | POA: Diagnosis not present

## 2020-07-26 DIAGNOSIS — Z91018 Allergy to other foods: Secondary | ICD-10-CM | POA: Insufficient documentation

## 2020-07-26 DIAGNOSIS — Z91010 Allergy to peanuts: Secondary | ICD-10-CM | POA: Diagnosis not present

## 2020-07-26 DIAGNOSIS — Z87891 Personal history of nicotine dependence: Secondary | ICD-10-CM | POA: Diagnosis not present

## 2020-07-26 DIAGNOSIS — Z419 Encounter for procedure for purposes other than remedying health state, unspecified: Secondary | ICD-10-CM

## 2020-07-26 DIAGNOSIS — Z96641 Presence of right artificial hip joint: Secondary | ICD-10-CM

## 2020-07-26 HISTORY — PX: TOTAL HIP ARTHROPLASTY: SHX124

## 2020-07-26 LAB — TYPE AND SCREEN
ABO/RH(D): O POS
Antibody Screen: NEGATIVE

## 2020-07-26 LAB — ABO/RH: ABO/RH(D): O POS

## 2020-07-26 SURGERY — ARTHROPLASTY, HIP, TOTAL, ANTERIOR APPROACH
Anesthesia: Spinal | Site: Hip | Laterality: Right

## 2020-07-26 MED ORDER — MIDAZOLAM HCL 2 MG/2ML IJ SOLN: 0.5000 mg | Freq: Once | INTRAMUSCULAR | Status: DC | PRN

## 2020-07-26 MED ORDER — AMISULPRIDE (ANTIEMETIC) 5 MG/2ML IV SOLN: 5.0000 mg | Freq: Once | INTRAVENOUS | Status: AC

## 2020-07-26 MED ORDER — FENTANYL CITRATE (PF) 100 MCG/2ML IJ SOLN
INTRAMUSCULAR | Status: DC | PRN
Start: 2020-07-26 — End: 2020-07-26
  Administered 2020-07-26: 50 ug via INTRAVENOUS

## 2020-07-26 MED ORDER — PHENYLEPHRINE HCL-NACL 10-0.9 MG/250ML-% IV SOLN
INTRAVENOUS | Status: DC | PRN
  Administered 2020-07-26: 50 ug/min via INTRAVENOUS

## 2020-07-26 MED ORDER — METHOCARBAMOL 500 MG PO TABS: 500.0000 mg | ORAL_TABLET | Freq: Four times a day (QID) | ORAL | Status: DC | PRN

## 2020-07-26 MED ORDER — PROMETHAZINE HCL 25 MG/ML IJ SOLN: 6.2500 mg | INTRAMUSCULAR | Status: DC | PRN

## 2020-07-26 MED ORDER — BUPIVACAINE IN DEXTROSE 0.75-8.25 % IT SOLN
INTRATHECAL | Status: DC | PRN
  Administered 2020-07-26: 2 mL via INTRATHECAL

## 2020-07-26 MED ORDER — ONDANSETRON HCL 4 MG PO TABS
4.0000 mg | ORAL_TABLET | Freq: Four times a day (QID) | ORAL | Status: DC | PRN
  Filled 2020-07-26: qty 1

## 2020-07-26 MED ORDER — LACTATED RINGERS IV BOLUS
250.0000 mL | Freq: Once | INTRAVENOUS | Status: AC
  Administered 2020-07-26: 250 mL via INTRAVENOUS

## 2020-07-26 MED ORDER — METHOCARBAMOL 500 MG IVPB - SIMPLE MED
INTRAVENOUS | Status: AC
  Filled 2020-07-26: qty 50

## 2020-07-26 MED ORDER — AMISULPRIDE (ANTIEMETIC) 5 MG/2ML IV SOLN
INTRAVENOUS | Status: AC
  Administered 2020-07-26: 5 mg via INTRAVENOUS
  Filled 2020-07-26: qty 2

## 2020-07-26 MED ORDER — MIDAZOLAM HCL 2 MG/2ML IJ SOLN
INTRAMUSCULAR | Status: DC | PRN
  Administered 2020-07-26: 2 mg via INTRAVENOUS

## 2020-07-26 MED ORDER — PROPOFOL 1000 MG/100ML IV EMUL
INTRAVENOUS | Status: AC
  Filled 2020-07-26: qty 100

## 2020-07-26 MED ORDER — DEXAMETHASONE SODIUM PHOSPHATE 10 MG/ML IJ SOLN
INTRAMUSCULAR | Status: AC
  Filled 2020-07-26: qty 1

## 2020-07-26 MED ORDER — TRANEXAMIC ACID-NACL 1000-0.7 MG/100ML-% IV SOLN
1000.0000 mg | INTRAVENOUS | Status: AC
  Administered 2020-07-26: 1000 mg via INTRAVENOUS
  Filled 2020-07-26: qty 100

## 2020-07-26 MED ORDER — CLINDAMYCIN PHOSPHATE 900 MG/50ML IV SOLN
900.0000 mg | INTRAVENOUS | Status: AC
  Administered 2020-07-26: 900 mg via INTRAVENOUS
  Filled 2020-07-26: qty 50

## 2020-07-26 MED ORDER — SODIUM CHLORIDE 0.9 % IR SOLN
Status: DC | PRN
  Administered 2020-07-26: 1000 mL

## 2020-07-26 MED ORDER — PHENYLEPHRINE 40 MCG/ML (10ML) SYRINGE FOR IV PUSH (FOR BLOOD PRESSURE SUPPORT)
PREFILLED_SYRINGE | INTRAVENOUS | Status: AC
  Filled 2020-07-26: qty 10

## 2020-07-26 MED ORDER — ONDANSETRON 4 MG PO TBDP
ORAL_TABLET | ORAL | Status: AC
  Administered 2020-07-26: 4 mg
  Filled 2020-07-26: qty 1

## 2020-07-26 MED ORDER — ACETAMINOPHEN 500 MG PO TABS
1000.0000 mg | ORAL_TABLET | Freq: Once | ORAL | Status: AC
  Administered 2020-07-26: 1000 mg via ORAL
  Filled 2020-07-26: qty 2

## 2020-07-26 MED ORDER — STERILE WATER FOR IRRIGATION IR SOLN
Status: DC | PRN
  Administered 2020-07-26: 2000 mL

## 2020-07-26 MED ORDER — ONDANSETRON HCL 4 MG/2ML IJ SOLN: 4.0000 mg | Freq: Four times a day (QID) | INTRAMUSCULAR | Status: DC | PRN

## 2020-07-26 MED ORDER — LACTATED RINGERS IV BOLUS
500.0000 mL | Freq: Once | INTRAVENOUS | Status: AC
  Administered 2020-07-26: 500 mL via INTRAVENOUS

## 2020-07-26 MED ORDER — LACTATED RINGERS IV SOLN: INTRAVENOUS | Status: DC

## 2020-07-26 MED ORDER — MIDAZOLAM HCL 2 MG/2ML IJ SOLN
INTRAMUSCULAR | Status: AC
  Filled 2020-07-26: qty 2

## 2020-07-26 MED ORDER — METHOCARBAMOL 500 MG IVPB - SIMPLE MED
500.0000 mg | Freq: Four times a day (QID) | INTRAVENOUS | Status: DC | PRN
  Administered 2020-07-26: 500 mg via INTRAVENOUS

## 2020-07-26 MED ORDER — POVIDONE-IODINE 10 % EX SWAB
2.0000 "application " | Freq: Once | CUTANEOUS | Status: AC
  Administered 2020-07-26: 2 via TOPICAL

## 2020-07-26 MED ORDER — ORAL CARE MOUTH RINSE: 15.0000 mL | Freq: Once | OROMUCOSAL | Status: AC

## 2020-07-26 MED ORDER — PROPOFOL 10 MG/ML IV BOLUS
INTRAVENOUS | Status: AC
  Filled 2020-07-26: qty 20

## 2020-07-26 MED ORDER — MEPERIDINE HCL 50 MG/ML IJ SOLN: 6.2500 mg | INTRAMUSCULAR | Status: DC | PRN

## 2020-07-26 MED ORDER — OXYCODONE HCL 5 MG PO TABS
ORAL_TABLET | ORAL | Status: DC
Start: 2020-07-26 — End: 2020-07-26
  Filled 2020-07-26: qty 1

## 2020-07-26 MED ORDER — ONDANSETRON HCL 4 MG/2ML IJ SOLN
INTRAMUSCULAR | Status: AC
  Filled 2020-07-26: qty 2

## 2020-07-26 MED ORDER — PROPOFOL 500 MG/50ML IV EMUL
INTRAVENOUS | Status: DC | PRN
  Administered 2020-07-26: 80 ug/kg/min via INTRAVENOUS

## 2020-07-26 MED ORDER — CHLORHEXIDINE GLUCONATE 0.12 % MT SOLN
15.0000 mL | Freq: Once | OROMUCOSAL | Status: AC
  Administered 2020-07-26: 15 mL via OROMUCOSAL

## 2020-07-26 MED ORDER — ONDANSETRON HCL 4 MG/2ML IJ SOLN
INTRAMUSCULAR | Status: DC | PRN
  Administered 2020-07-26: 4 mg via INTRAVENOUS

## 2020-07-26 MED ORDER — PHENYLEPHRINE 40 MCG/ML (10ML) SYRINGE FOR IV PUSH (FOR BLOOD PRESSURE SUPPORT)
PREFILLED_SYRINGE | INTRAVENOUS | Status: DC | PRN
  Administered 2020-07-26: 120 ug via INTRAVENOUS

## 2020-07-26 MED ORDER — FENTANYL CITRATE (PF) 100 MCG/2ML IJ SOLN
INTRAMUSCULAR | Status: AC
  Filled 2020-07-26: qty 2

## 2020-07-26 MED ORDER — 0.9 % SODIUM CHLORIDE (POUR BTL) OPTIME
TOPICAL | Status: DC | PRN
  Administered 2020-07-26: 1000 mL

## 2020-07-26 MED ORDER — DEXAMETHASONE SODIUM PHOSPHATE 10 MG/ML IJ SOLN
INTRAMUSCULAR | Status: DC | PRN
  Administered 2020-07-26: 10 mg via INTRAVENOUS

## 2020-07-26 MED ORDER — PROPOFOL 1000 MG/100ML IV EMUL
INTRAVENOUS | Status: AC
  Filled 2020-07-26: qty 200

## 2020-07-26 MED ORDER — OXYCODONE HCL 5 MG/5ML PO SOLN: 5.0000 mg | Freq: Once | ORAL | Status: AC | PRN

## 2020-07-26 MED ORDER — HYDROMORPHONE HCL 1 MG/ML IJ SOLN: 0.2500 mg | INTRAMUSCULAR | Status: DC | PRN

## 2020-07-26 MED ORDER — OXYCODONE HCL 5 MG PO TABS
5.0000 mg | ORAL_TABLET | Freq: Once | ORAL | Status: AC | PRN
  Administered 2020-07-26: 5 mg via ORAL

## 2020-07-26 MED ORDER — PROPOFOL 10 MG/ML IV BOLUS
INTRAVENOUS | Status: DC | PRN
  Administered 2020-07-26 (×2): 40 mg via INTRAVENOUS

## 2020-07-26 SURGICAL SUPPLY — 42 items
APL SKNCLS STERI-STRIP NONHPOA (GAUZE/BANDAGES/DRESSINGS)
BAG SPEC THK2 15X12 ZIP CLS (MISCELLANEOUS)
BAG ZIPLOCK 12X15 (MISCELLANEOUS) IMPLANT
BENZOIN TINCTURE PRP APPL 2/3 (GAUZE/BANDAGES/DRESSINGS) IMPLANT
BLADE SAW SGTL 18X1.27X75 (BLADE) ×2 IMPLANT
COLLAR OFFSET CORAIL SZ 12 HIP (Stem) ×1 IMPLANT
CORAIL OFFSET COLLAR SZ 12 HIP (Stem) ×2 IMPLANT
COVER PERINEAL POST (MISCELLANEOUS) ×2 IMPLANT
COVER SURGICAL LIGHT HANDLE (MISCELLANEOUS) ×2 IMPLANT
COVER WAND RF STERILE (DRAPES) ×2 IMPLANT
CUP ACET PNNCL SECTR W/GRIP 56 (Hips) ×1 IMPLANT
DRAPE STERI IOBAN 125X83 (DRAPES) ×2 IMPLANT
DRAPE U-SHAPE 47X51 STRL (DRAPES) ×4 IMPLANT
DRSG AQUACEL AG ADV 3.5X10 (GAUZE/BANDAGES/DRESSINGS) ×2 IMPLANT
DURAPREP 26ML APPLICATOR (WOUND CARE) ×2 IMPLANT
ELECT REM PT RETURN 15FT ADLT (MISCELLANEOUS) ×2 IMPLANT
GAUZE XEROFORM 1X8 LF (GAUZE/BANDAGES/DRESSINGS) ×2 IMPLANT
GLOVE BIO SURGEON STRL SZ7.5 (GLOVE) ×2 IMPLANT
GLOVE BIOGEL PI IND STRL 8 (GLOVE) ×2 IMPLANT
GLOVE BIOGEL PI INDICATOR 8 (GLOVE) ×2
GLOVE ECLIPSE 8.0 STRL XLNG CF (GLOVE) ×4 IMPLANT
GOWN STRL REUS W/TWL XL LVL3 (GOWN DISPOSABLE) ×4 IMPLANT
HANDPIECE INTERPULSE COAX TIP (DISPOSABLE) ×2
HEAD CERAMIC 36 PLUS5 (Hips) ×2 IMPLANT
KIT TURNOVER KIT A (KITS) IMPLANT
PACK ANTERIOR HIP CUSTOM (KITS) ×2 IMPLANT
PENCIL SMOKE EVACUATOR (MISCELLANEOUS) ×2 IMPLANT
PINN SECTOR W/GRIP ACE CUP 56 (Hips) ×2 IMPLANT
PINNACLE ALTRX PLUS 4 N 36X56 (Hips) ×2 IMPLANT
SET HNDPC FAN SPRY TIP SCT (DISPOSABLE) ×1 IMPLANT
STAPLER VISISTAT 35W (STAPLE) ×2 IMPLANT
STRIP CLOSURE SKIN 1/2X4 (GAUZE/BANDAGES/DRESSINGS) IMPLANT
SUT ETHIBOND NAB CT1 #1 30IN (SUTURE) ×2 IMPLANT
SUT ETHILON 2 0 PS N (SUTURE) IMPLANT
SUT MNCRL AB 4-0 PS2 18 (SUTURE) IMPLANT
SUT VIC AB 0 CT1 36 (SUTURE) ×2 IMPLANT
SUT VIC AB 1 CT1 36 (SUTURE) ×2 IMPLANT
SUT VIC AB 2-0 CT1 27 (SUTURE) ×4
SUT VIC AB 2-0 CT1 TAPERPNT 27 (SUTURE) ×2 IMPLANT
TRAY CATH 16FR W/PLASTIC CATH (SET/KITS/TRAYS/PACK) ×2 IMPLANT
YANKAUER SUCT BULB TIP 10FT TU (MISCELLANEOUS) ×2 IMPLANT
YANKAUER SUCT BULB TIP NO VENT (SUCTIONS) ×2 IMPLANT

## 2020-07-26 NOTE — Addendum Note (Signed)
Addendum  created 07/26/20 1626 by Jairo Ben, MD   Order list changed

## 2020-07-26 NOTE — Transfer of Care (Signed)
Immediate Anesthesia Transfer of Care Note  Patient: Zachary Best  Procedure(s) Performed: RIGHT TOTAL HIP ARTHROPLASTY ANTERIOR APPROACH (Right Hip)  Patient Location: PACU  Anesthesia Type:Spinal  Level of Consciousness: drowsy  Airway & Oxygen Therapy: Patient Spontanous Breathing and Patient connected to face mask oxygen  Post-op Assessment: Report given to RN and Post -op Vital signs reviewed and stable  Post vital signs: Reviewed and stable  Last Vitals:  Vitals Value Taken Time  BP 105/71 07/26/20 0845  Temp    Pulse 63 07/26/20 0846  Resp 14 07/26/20 0848  SpO2 100 % 07/26/20 0846  Vitals shown include unvalidated device data.  Last Pain:  Vitals:   07/26/20 0555  TempSrc: Oral  PainSc:       Patients Stated Pain Goal: 4 (07/26/20 0550)  Complications: No complications documented.

## 2020-07-26 NOTE — Op Note (Signed)
NAMEGURINDER, TORAL MEDICAL RECORD JJ:[id number] ACCOUNT [id number] DATE OF BIRTH:[birth year] FACILITY: [facility] LOCATION: [facility] PHYSICIAN:Nikya Busler Aretha Parrot, MD  OPERATIVE REPORT  DATE OF PROCEDURE:  07/26/2020  PREOPERATIVE DIAGNOSIS:  Primary osteoarthritis and degenerative joint disease, right hip.  POSTOPERATIVE DIAGNOSIS:  Primary osteoarthritis and degenerative joint disease, right hip.  PROCEDURE:  Right total hip arthroplasty through direct anterior approach.  IMPLANTS:  DePuy Sector Gription acetabular component size 56, size 36+4 neutral polyethylene liner, size 12 Corail femoral component with high offset, size 36+5 ceramic hip ball.  SURGEON:  Vanita Panda. Magnus Ivan, MD  ASSISTANT:  Richardean Canal, PA-C  ANESTHESIA:  Spinal.  ANTIBIOTICS:  900 mg IV clindamycin.  ESTIMATED BLOOD LOSS:  200 mL.  COMPLICATIONS:  None.  INDICATIONS:  The patient is a 64 year old gentleman well known to me.  He has debilitating arthritis involving his right hip.  At this point, it is detrimentally affecting his mobility, his quality of life, and his activities of daily living to the  point he does wish to proceed with a total hip arthroplasty.  We talked about the risk of acute blood loss anemia, nerve or vessel injury, fracture, infection, dislocation, DVT and implant failure as well as skin and soft tissue issues.  We talked about  the goals being decreased pain, improve mobility and overall improve quality of life.  DESCRIPTION OF PROCEDURE:  After informed consent was obtained and appropriate right hip was marked he was brought to the operating room and sat up on a stretcher.  Spinal anesthesia was obtained.  He was then laid in supine position on a stretcher.  I  was able to assess his leg length and he is significantly short on the right comparing the right and left hips and he does significantly externally rotate.  Traction boots were placed on both his feet.  He was placed  supine on the Hana fracture table,  the perineal post in place and both legs in line skeletal traction device and no traction applied.  His right operative hip was prepped and draped with DuraPrep and sterile drapes.  A time-out was called.  He was identified as correct patient, correct  right hip.  I then made an incision just inferior and posterior to the anterior superior iliac spine and carried this obliquely down the leg.  We dissected down tensor fascia lata muscle.  Tensor fascia was then divided longitudinally to proceed with  direct anterior approach to the hip.  We identified and cauterized circumflex vessels and identified the hip capsule, opened the hip capsule in an L-type format finding a moderate joint effusion and significant osteophytes around the femoral head and  neck laterally.  I placed Cobra retractors within the joint capsule around the medial and lateral femoral neck and made our femoral neck cut with an oscillating saw just proximal to the lesser trochanter and completed this with osteotome.  We placed a  corkscrew guide in the femoral head and removed the trial humeral head in its entirety and found a wide area devoid of cartilage and flattened.  I then placed a bent Hohmann over the medial acetabular rim and removed remnants of the acetabular labrum and  other debris.  I then began reaming sequentially from a small reamer, going up in stepwise increments to all the way to a size 55.  With all reamers under direct visualization, the last reamer was placed under direct fluoroscopy, so we could obtain our  depth of reaming, our inclination and anteversion.  I  then placed the real DePuy Sector Gription acetabular component size 56 and based on his offset we went with a 36+4 polyethylene liner.  Attention was then turned to the femur.  With the leg  externally rotated to 120 degrees, extended and adducted, we were able to place a Mueller retractor medially and Hohman retractor behind  the greater trochanter, we released lateral joint capsule and used a box-cutting osteotome to enter the femoral canal  and a rongeur to lateralize.  Then began broaching using the Corail broaching system from a size 8 going up to a size 12.  With a size 12 in place, we tried a high offset femoral neck and a 36+1.5 hip ball, reduced this in the acetabulum and felt like  we needed just a little bit more leg length and offset, but it felt stable, but certainly was going to be concerning given his externally rotated position, the way he lays.  We dislocated the hip and removed the trial components.  I placed the real  Corail femoral component with high offset size 12 and the real 36+5 ceramic hip ball and again reduced this in the acetabulum.  We were pleased with the leg length, offset, range of motion and stability assessed radiographically and mechanically.  We  then irrigated the soft tissue with normal saline solution using pulsatile lavage.  We closed the joint capsule with interrupted #1 Ethibond suture, followed by #1 Vicryl to close the tensor fascia, 0 Vicryl was used to close the deep tissue and 2-0  Vicryl was used to close the subcutaneous tissue.  The skin was reapproximated with staples.  Xeroform and Aquacel dressing was applied.  An in and out catheter was performed of the bladder and he was taken to recovery room in stable condition with all  final counts being correct.  No complications noted.  Note Rexene Edison, PA-C, assisted in the entire case and his assistance was crucial for facilitating all aspects of this case.  CN/NUANCE  D:07/26/2020 T:07/26/2020 JOB:[id number]

## 2020-07-26 NOTE — Discharge Instructions (Signed)

## 2020-07-26 NOTE — Interval H&P Note (Signed)
History and Physical Interval Note: The patient is here today for a right total hip arthroplasty to treat the pain from his right hip osteoarthritis.  There has been no acute changes in medical status.  See recent H&P.  The risk and benefits of surgery been explained in detail and informed consent is obtained.  The right hip has been marked.  07/26/2020 7:04 AM  Zachary Best  has presented today for surgery, with the diagnosis of right hip osteoarthritis.  The various methods of treatment have been discussed with the patient and family. After consideration of risks, benefits and other options for treatment, the patient has consented to  Procedure(s): RIGHT TOTAL HIP ARTHROPLASTY ANTERIOR APPROACH (Right) as a surgical intervention.  The patient's history has been reviewed, patient examined, no change in status, stable for surgery.  I have reviewed the patient's chart and labs.  Questions were answered to the patient's satisfaction.     Kathryne Hitch

## 2020-07-26 NOTE — Evaluation (Signed)
Physical Therapy Evaluation Patient Details Name: Zachary Best MRN: [id number] DOB: 05/29/1956 Today's Date: 07/26/2020   History of Present Illness  Pt s/p R THR   Clinical Impression  Pt s/p R THR and presents with decreased R LE strength/ROM and post op pain limiting functional mobility.  Pt up to ambulate in hall, negotiated step with min difficulty, and reviewed car transfer.  Pt has all equipment at home and HHPT will follow up.    Follow Up Recommendations Home health PT    Equipment Recommendations  None recommended by PT    Recommendations for Other Services       Precautions / Restrictions Precautions Precautions: Fall Restrictions Weight Bearing Restrictions: No      Mobility  Bed Mobility Overal bed mobility: Needs Assistance Bed Mobility: Supine to Sit     Supine to sit: Min guard     General bed mobility comments: cues for use of L LE to self assist  Transfers Overall transfer level: Needs assistance Equipment used: Rolling walker (2 wheeled) Transfers: Sit to/from Stand Sit to Stand: Min guard;Supervision         General transfer comment: cues for LE management and use of UEs to self assist  Ambulation/Gait Ambulation/Gait assistance: Min guard;Supervision Gait Distance (Feet): 160 Feet Assistive device: Rolling walker (2 wheeled) Gait Pattern/deviations: Step-to pattern;Decreased step length - right;Decreased step length - left;Shuffle;Trunk flexed Gait velocity: decr   General Gait Details: cues for posture, position from RW and initial sequence  Stairs Stairs: Yes Stairs assistance: Min assist;Min guard Stair Management: No rails;Step to pattern;Forwards;With walker Number of Stairs: 2 General stair comments: single step twice with RW and min cues for sequence  Wheelchair Mobility    Modified Rankin (Stroke Patients Only)       Balance Overall balance assessment: Mild deficits observed, not formally tested                                            Pertinent Vitals/Pain Pain Assessment: 0-10 Pain Score: 4  Pain Location: R hip Pain Descriptors / Indicators: Aching;Sore Pain Intervention(s): Limited activity within patient's tolerance;Monitored during session;Premedicated before session;Ice applied    Home Living Family/patient expects to be discharged to:: Private residence Living Arrangements: Children Available Help at Discharge: Family Type of Home: House Home Access: Stairs to enter   Technical brewer of Steps: 1 Home Layout: One level Home Equipment: Environmental consultant - 2 wheels;Cane - single point;Bedside commode      Prior Function Level of Independence: Independent               Hand Dominance        Extremity/Trunk Assessment   Upper Extremity Assessment Upper Extremity Assessment: Overall WFL for tasks assessed    Lower Extremity Assessment Lower Extremity Assessment: RLE deficits/detail    Cervical / Trunk Assessment Cervical / Trunk Assessment: Normal  Communication   Communication: No difficulties  Cognition Arousal/Alertness: Awake/alert Behavior During Therapy: WFL for tasks assessed/performed Overall Cognitive Status: Within Functional Limits for tasks assessed                                        General Comments      Exercises Total Joint Exercises Ankle Circles/Pumps: AROM;Both;15 reps;Supine   Assessment/Plan    PT  Assessment All further PT needs can be met in the next venue of care  PT Problem List Decreased strength;Decreased range of motion;Decreased activity tolerance;Decreased balance;Decreased mobility;Decreased knowledge of use of DME;Pain       PT Treatment Interventions DME instruction;Gait training;Stair training;Functional mobility training;Therapeutic activities;Therapeutic exercise;Patient/family education    PT Goals (Current goals can be found in the Care Plan section)  Acute Rehab PT Goals Patient  Stated Goal: Regain IND PT Goal Formulation: With patient Time For Goal Achievement: 07/26/20    Frequency Min 1X/week   Barriers to discharge        Co-evaluation               AM-PAC PT "6 Clicks" Mobility  Outcome Measure Help needed turning from your back to your side while in a flat bed without using bedrails?: None Help needed moving from lying on your back to sitting on the side of a flat bed without using bedrails?: A Little Help needed moving to and from a bed to a chair (including a wheelchair)?: A Little Help needed standing up from a chair using your arms (e.g., wheelchair or bedside chair)?: A Little Help needed to walk in hospital room?: A Little Help needed climbing 3-5 steps with a railing? : A Little 6 Click Score: 19    End of Session Equipment Utilized During Treatment: Gait belt Activity Tolerance: Patient tolerated treatment well Patient left: in chair;with call bell/phone within reach Nurse Communication: Mobility status PT Visit Diagnosis: Difficulty in walking, not elsewhere classified (R26.2)    Time: 9417-9199 PT Time Calculation (min) (ACUTE ONLY): 29 min   Charges:   PT Evaluation $PT Eval Low Complexity: 1 Low PT Treatments $Gait Training: 8-22 mins        [place] Pager [phone] Office [phone]   Amilcar Reever 07/26/2020, 2:59 PM

## 2020-07-26 NOTE — Anesthesia Procedure Notes (Signed)
Spinal  Patient location during procedure: OR Start time: 07/26/2020 7:24 AM Staffing Performed: anesthesiologist  Resident/CRNA: [place] Meah Jiron C, CRNA Preanesthetic Checklist Completed: patient identified, IV checked, site marked, risks and benefits discussed, surgical consent, monitors and equipment checked, pre-op evaluation and timeout performed Spinal Block Patient position: sitting Prep: Betadine, DuraPrep and site prepped and draped Patient monitoring: heart rate, continuous pulse ox and blood pressure Approach: midline Location: L3-4 Injection technique: single-shot Needle Needle type: Pencan  Needle gauge: 24 G Needle length: 9 cm Assessment Sensory level: T4 Additional Notes Expiration date of kit checked and confirmed. Patient tolerated procedure well, without complications.

## 2020-07-26 NOTE — Brief Op Note (Signed)
07/26/2020  8:27 AM  PATIENT:  Zachary Best  64 y.o. male  PRE-OPERATIVE DIAGNOSIS:  right hip osteoarthritis  POST-OPERATIVE DIAGNOSIS:  right hip osteoarthritis  PROCEDURE:  Procedure(s): RIGHT TOTAL HIP ARTHROPLASTY ANTERIOR APPROACH (Right)  SURGEON:  Surgeon(s) and Role:    Kathryne Hitch, MD - Primary  PHYSICIAN ASSISTANT:  Rexene Edison, PA-C  ANESTHESIA:   spinal  EBL:  200 mL   COUNTS:  YES  DICTATION: .Other Dictation: Dictation Number [id number]  PLAN OF CARE: Discharge to home after PACU  PATIENT DISPOSITION:  PACU - hemodynamically stable.   Delay start of Pharmacological VTE agent (>24hrs) due to surgical blood loss or risk of bleeding: no

## 2020-07-26 NOTE — Anesthesia Postprocedure Evaluation (Signed)
Anesthesia Post Note  Patient: Zachary Best  Procedure(s) Performed: RIGHT TOTAL HIP ARTHROPLASTY ANTERIOR APPROACH (Right Hip)     Patient location during evaluation: PACU Anesthesia Type: Spinal Level of consciousness: awake and alert, patient cooperative and oriented Pain management: pain level controlled Vital Signs Assessment: post-procedure vital signs reviewed and stable Respiratory status: spontaneous breathing, nonlabored ventilation and respiratory function stable Cardiovascular status: blood pressure returned to baseline and stable Postop Assessment: no apparent nausea or vomiting, spinal receding and patient able to bend at knees Anesthetic complications: no   No complications documented.  Last Vitals:  Vitals:   07/26/20 1030 07/26/20 1045  BP: 115/83 116/78  Pulse: (!) 53 (!) 59  Resp: 13 15  Temp:  (!) 36.4 C  SpO2: 95% 95%    Last Pain:  Vitals:   07/26/20 1045  TempSrc:   PainSc: 1                  Elmina Hendel,E. Corbin Hott

## 2020-07-26 NOTE — Care Plan (Signed)
[facility] RNCM call to patient to discuss his Right total hip arthroplasty with Dr. Magnus Ivan on Friday, 07/26/20. He is aware that his surgery will be ambulatory/same day discharge. Patient has a daughter that will be assisting at home after discharge. He has a FWW as well as a 3in1/BSC. No DME needs. Anticipate HHPT will be needed after discharge. Choice provided and referral made to [facility]. Reviewed all post-op instructions and will be available for post op needs. Post Op appointment scheduled for 2 weeks on 08/08/20 at 1:30 pm. Please call Ralph Dowdy, RN Case Manager for questions/concerns. [phone].

## 2020-07-26 NOTE — Addendum Note (Signed)
Addendum  created 07/26/20 1114 by Jairo Ben, MD   Attestation recorded in Intraprocedure, Intraprocedure Attestations deleted, Intraprocedure Attestations filed

## 2020-07-29 ENCOUNTER — Encounter (HOSPITAL_COMMUNITY): Payer: Self-pay | Admitting: Orthopaedic Surgery

## 2020-07-29 ENCOUNTER — Telehealth: Payer: Self-pay | Admitting: *Deleted

## 2020-07-29 NOTE — Telephone Encounter (Signed)
Transition Care Management Follow-Up Telephone Call   Date discharged and where:07/28/2020 [place]  How have you been since you were released from the hospital? Better. Had Hip Surgery  Any patient concerns? NO  Items Reviewed:   Meds: Yes. Stopped Finasteride before surgery. Going to call Urology.   Allergies: Yes  Dietary Changes Reviewed: Yes  Functional Questionnaire:  Independent-I Dependent-D  ADLs: I   Dressing- I    Eating-I   Maintaining continence-I   Transferring-I Has Walker   Transportation-I   Meal Prep-I   Managing Meds- I  Confirmed importance and Date/Time of follow-up visits scheduled: Yes Appointment with Shanda Bumps 08/07/2020   Confirmed with patient if condition worsens to call PCP or go to the Emergency Dept. Patient was given office number and encouraged to call back with questions or concerns: Yes

## 2020-08-04 ENCOUNTER — Other Ambulatory Visit: Payer: Self-pay | Admitting: Orthopaedic Surgery

## 2020-08-04 MED ORDER — CIPROFLOXACIN HCL 500 MG PO TABS: 500.0000 mg | ORAL_TABLET | Freq: Two times a day (BID) | ORAL | 0 refills | Status: DC

## 2020-08-07 ENCOUNTER — Ambulatory Visit (INDEPENDENT_AMBULATORY_CARE_PROVIDER_SITE_OTHER): Payer: Medicare Other | Admitting: Nurse Practitioner

## 2020-08-07 ENCOUNTER — Encounter: Payer: Self-pay | Admitting: Nurse Practitioner

## 2020-08-07 ENCOUNTER — Other Ambulatory Visit: Payer: Self-pay

## 2020-08-07 VITALS — BP 110/80 | HR 85 | Temp 97.8°F | Ht 72.0 in | Wt 198.8 lb

## 2020-08-07 DIAGNOSIS — R350 Frequency of micturition: Secondary | ICD-10-CM | POA: Diagnosis not present

## 2020-08-07 DIAGNOSIS — M1611 Unilateral primary osteoarthritis, right hip: Secondary | ICD-10-CM | POA: Diagnosis not present

## 2020-08-07 DIAGNOSIS — R300 Dysuria: Secondary | ICD-10-CM | POA: Diagnosis not present

## 2020-08-07 DIAGNOSIS — N401 Enlarged prostate with lower urinary tract symptoms: Secondary | ICD-10-CM | POA: Diagnosis not present

## 2020-08-07 NOTE — Progress Notes (Signed)
\\   Careteam: Patient Care Team: [name], NP as PCP - General (Geriatric Medicine)  PLACE OF SERVICE:  [facility]  Advanced Directive information Does Patient Have a Medical Advance Directive?: No  Allergies  Allergen Reactions   Beef-Derived Products Anaphylaxis   Other     Peanut Butter, Anaphylaxis    Penicillins Other (See Comments)    "childhood allergy", he doesn't know reaction     Chief Complaint  Patient presents with   Transitions Of Care    Transitional care visit. Patient had surgery on right hip on 07/26/2020. Patient was discharged same day. Patient states that since surgery he has been up every 30 to 40 minutes to urinate. Patient states urination very painful. He has been eating, but not really having an appetite. Patient has been taking Azo, was given cipro, but not helping. Been dealing with this past 2 weeks. No fever.   Best Practice Recommendations    Tetanus/Tdap, Flu vaccine,Pneumonia, vaccine     HPI: Patient is a 64 y.o. male for transition of care  Started having painful urination after surgery "pissing razor blades" for 2 weeks. Urologist did culture and was normal. No blood in urine, redness, swelling. Has been taking AZO which is the only reason he has stayed out of the ED. Has been taking AZO for 5 days Quit taking methocarbamol and oxycodone Has urinated 6 times today.  Drinking lot of fluids.  He has been in touch with urologist but has not heard back from them.   bph- continues on finasteride- BASELINE up 3-4 times at night, last night he was up at least 12 times which is has been this way for 10 days.   Feels like his bp bottomed out. Went to the bathroom but by the time he got back to bed could not focus. Had to urinate and have a BM but could not make it to the bathroom. Feeling of dizziness and "drunk"  Right total hip on 9/17- reports hip pain has improved. Reports recovery in that aspect is "grade A"   Review of  Systems:  Review of Systems  Constitutional: Positive for malaise/fatigue. Negative for chills and fever.  Respiratory: Negative for cough.   Cardiovascular: Negative for leg swelling.  Gastrointestinal: Negative for abdominal pain, diarrhea and heartburn.  Genitourinary: Positive for dysuria, frequency and urgency. Negative for flank pain and hematuria.  Musculoskeletal: Negative for joint pain and myalgias.  Neurological: Positive for dizziness.    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    Allergy    seasonal   Anaphylaxis    Per [facility] New Patient Packet    Arthritis    Family history of adverse reaction to anesthesia    Past Surgical History:  Procedure Laterality Date   APPENDECTOMY  1966   Per [facility] New Patient Packet    TOTAL HIP ARTHROPLASTY Right 07/26/2020   Procedure: RIGHT TOTAL HIP ARTHROPLASTY ANTERIOR APPROACH;  Surgeon: Kathryne Hitch, MD;  Location: [facility]S;  Service: Orthopedics;  Laterality: Right;   Social History:   reports that he quit smoking about 16 years ago. His smoking use included cigarettes. He has a 45.00 pack-year smoking history. He has never used smokeless tobacco. He reports current alcohol use. He reports that he does not use drugs.  Family History  Problem Relation Age of Onset   Stroke Mother    Diabetes Mother  borderline   Alzheimer's disease Father    Cancer Sister     Medications: Patient's Medications  New Prescriptions   No medications on file  Previous Medications   ASPIRIN (ASPIRIN 81) 81 MG CHEWABLE TABLET    Chew 1 tablet (81 mg total) by mouth 2 (two) times daily after a meal.   ASPIRIN-ACETAMINOPHEN-CAFFEINE (GOODYS EXTRA STRENGTH) 500-325-65 MG PACK    Take 1 packet by mouth in the morning and at bedtime.    CIPROFLOXACIN (CIPRO) 500 MG TABLET    Take 1 tablet (500 mg total) by mouth 2 (two) times daily.    EPINEPHRINE 0.3 MG/0.3 ML IJ SOAJ INJECTION    Inject 0.3 mLs (0.3 mg total) into the muscle as needed for anaphylaxis.   FINASTERIDE (PROSCAR) 5 MG TABLET    Take 5 mg by mouth daily with supper.    METHOCARBAMOL (ROBAXIN) 500 MG TABLET    Take 1 tablet (500 mg total) by mouth every 6 (six) hours as needed for muscle spasms.   NAPROXEN SODIUM (ALEVE) 220 MG TABLET    Take 220 mg by mouth in the morning.    ONDANSETRON (ZOFRAN ODT) 4 MG DISINTEGRATING TABLET    Take 1 tablet (4 mg total) by mouth every 8 (eight) hours as needed for nausea or vomiting.   OXYCODONE (ROXICODONE) 5 MG IMMEDIATE RELEASE TABLET    Take 1-2 tablets (5-10 mg total) by mouth every 6 (six) hours as needed for severe pain.  Modified Medications   No medications on file  Discontinued Medications   No medications on file    Physical Exam:  Vitals:   08/07/20 0937  BP: 110/80  Pulse: 85  Temp: 97.8 F (36.6 C)  TempSrc: Temporal  SpO2: 98%  Weight: 198 lb 12.8 oz (90.2 kg)  Height: 6' (1.829 m)   Body mass index is 26.96 kg/m. Wt Readings from Last 3 Encounters:  08/07/20 198 lb 12.8 oz (90.2 kg)  07/26/20 202 lb (91.6 kg)  07/23/20 202 lb (91.6 kg)    Physical Exam Constitutional:      General: He is not in acute distress.    Appearance: He is well-developed. He is not diaphoretic.  HENT:     Head: Normocephalic and atraumatic.     Mouth/Throat:     Pharynx: No oropharyngeal exudate.  Eyes:     Conjunctiva/sclera: Conjunctivae normal.     Pupils: Pupils are equal, round, and reactive to light.  Cardiovascular:     Rate and Rhythm: Normal rate and regular rhythm.     Heart sounds: Normal heart sounds.  Pulmonary:     Effort: Pulmonary effort is normal.     Breath sounds: Normal breath sounds.  Abdominal:     General: Bowel sounds are normal.     Palpations: Abdomen is soft.     Tenderness: There is abdominal tenderness (suprapubic). There is no right CVA tenderness or left CVA tenderness.    Musculoskeletal:        General: No tenderness.     Cervical back: Normal range of motion and neck supple.     Comments: Right hip incision with dressing intact Bruising noted down posterior leg  Skin:    General: Skin is warm and dry.  Neurological:     Mental Status: He is alert and oriented to person, place, and time.    Labs reviewed: Basic Metabolic Panel: Recent Labs    08/18/19 1431 05/30/20 0804  NA 139 137  K 4.3  4.3  CL 107 106  CO2 25 22  GLUCOSE 87 94  BUN 19 18  CREATININE 1.29* 1.15  CALCIUM 9.2 9.2   Liver Function Tests: Recent Labs    08/18/19 1431 05/30/20 0804  AST 13 14  ALT 13 14  BILITOT 0.5 0.8  PROT 6.8 6.9   No results for input(s): LIPASE, AMYLASE in the last 8760 hours. No results for input(s): AMMONIA in the last 8760 hours. CBC: Recent Labs    08/18/19 1431 07/23/20 1044  WBC 6.9 6.8  NEUTROABS 4,637  --   HGB 13.6 13.8  HCT 40.8 41.9  MCV 87.6 91.1  PLT 214 177   Lipid Panel: Recent Labs    08/18/19 1431 11/29/19 0811 05/30/20 0804  CHOL 200* 199 216*  HDL 37* 41 41  LDLCALC 123* 128* 148*  TRIG 260* 186* 141  CHOLHDL 5.4* 4.9 5.3*   TSH: No results for input(s): TSH in the last 8760 hours. A1C: No results found for: HGBA1C   Assessment/Plan 1. Dysuria Culture from urologist negative, on cipro 500 mg BID for 3 days with overall no improvement in symptoms, he is getting worse, call made to urologist for stat appt, they plan to see about getting him in today for evaluation and will call pt with appt.   2. Benign prostatic hyperplasia with urinary frequency Currently on proscar, now with worsening urinary frequency and pain, plan to follow up with urology stat  3. Unilateral primary osteoarthritis, right hip Doing well post op with pain and mobility, has follow up with surgeon tomorrow.   Next appt: 09/04/2020  Janene Harvey. Biagio Borg  [facility] [phone]

## 2020-08-08 ENCOUNTER — Ambulatory Visit (INDEPENDENT_AMBULATORY_CARE_PROVIDER_SITE_OTHER): Payer: Medicare Other | Admitting: Orthopaedic Surgery

## 2020-08-08 ENCOUNTER — Encounter: Payer: Self-pay | Admitting: Orthopaedic Surgery

## 2020-08-08 DIAGNOSIS — Z96641 Presence of right artificial hip joint: Secondary | ICD-10-CM

## 2020-08-08 MED ORDER — OXYCODONE HCL 5 MG PO TABS: 5.0000 mg | ORAL_TABLET | Freq: Four times a day (QID) | ORAL | 0 refills | Status: DC | PRN

## 2020-08-08 NOTE — Progress Notes (Signed)
Daruis Swaim comes in today in 2 weeks after a right total hip arthroplasty.  The hip arthroplasty itself is doing very well but he had significant issue with urinary retention after surgery.  He now has a indwelling Foley catheter and is being followed by urology.  He has been on a baby aspirin twice a day.  I will have him stop those.  Examination of his right hip incision shows that there is a mild to moderate seroma but he did not want this drained.  There is no evidence of infection.  I remove the staples in place Steri-Strips.  His leg lengths are equal.  Again he will stop the aspirin twice a day.  I will refill his oxycodone.  I will defer any other treatment from kidney and bladder to the urologist.  Obviously if there is any issues at all he will let me know.  I would like to see him back in 4 weeks for repeat exam but no x-rays are needed.  All questions and concerns were answered and addressed.

## 2020-09-04 ENCOUNTER — Encounter: Payer: Self-pay | Admitting: Nurse Practitioner

## 2020-09-04 ENCOUNTER — Other Ambulatory Visit: Payer: Self-pay

## 2020-09-04 ENCOUNTER — Ambulatory Visit (INDEPENDENT_AMBULATORY_CARE_PROVIDER_SITE_OTHER): Payer: Medicare Other | Admitting: Nurse Practitioner

## 2020-09-04 VITALS — BP 110/70 | HR 72 | Temp 96.8°F | Ht 72.0 in | Wt 200.0 lb

## 2020-09-04 DIAGNOSIS — R350 Frequency of micturition: Secondary | ICD-10-CM

## 2020-09-04 DIAGNOSIS — E785 Hyperlipidemia, unspecified: Secondary | ICD-10-CM | POA: Diagnosis not present

## 2020-09-04 DIAGNOSIS — M1611 Unilateral primary osteoarthritis, right hip: Secondary | ICD-10-CM

## 2020-09-04 DIAGNOSIS — N401 Enlarged prostate with lower urinary tract symptoms: Secondary | ICD-10-CM

## 2020-09-04 DIAGNOSIS — Z23 Encounter for immunization: Secondary | ICD-10-CM

## 2020-09-04 DIAGNOSIS — H9113 Presbycusis, bilateral: Secondary | ICD-10-CM

## 2020-09-04 NOTE — Progress Notes (Signed)
Careteam: Patient Care Team: [name], NP as PCP - General (Geriatric Medicine)  PLACE OF SERVICE:  [facility]  Advanced Directive information    Allergies  Allergen Reactions  . Beef-Derived Products Anaphylaxis  . Other     Peanut Butter, Anaphylaxis   . Penicillins Other (See Comments)    "childhood allergy", he doesn't know reaction     Chief Complaint  Patient presents with  . Medical Management of Chronic Issues    3 month follow-up. Discuss need for TD/TDap, PNA, and flu vaccine today      HPI: Patient is a 64 y.o. male for routine follow up.   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 Took out catheter on 08/20/20, started rapaflo on 09/01/20 and knew immediately it was working. Felt improvement within 12 hours.  Reports some discomfort but overall better.  Has used flomax and finasteride. Has appt with Dr Logan Bores tomorrow to follow up  Following with Dr Magnus Ivan tomorrow. Overall doing good. Has a hard time with a first few steps but once he gets moving he does well.   Has a hard time sleeping. Getting up every 1 to go to the bathroom. Drinks a lot of fluids throughout the day and in the evening.   Finally having normal BM.   Hyperlipidemia- "waiting to go on diet"  LDL elevated.  Does not want to start medication for cholesterol until he gets his weight down.   Review of Systems:  Review of Systems  Constitutional: Negative for chills, fever and weight loss.  HENT: Negative for tinnitus.   Respiratory: Negative for cough, sputum production and shortness of breath.   Cardiovascular: Negative for chest pain, palpitations and leg swelling.  Gastrointestinal: Negative for abdominal pain, constipation, diarrhea and heartburn.  Genitourinary: Positive for frequency. Negative for dysuria and urgency.  Musculoskeletal: Negative for back pain, falls, joint pain and myalgias.  Skin: Negative.   Neurological: Negative for dizziness and headaches.    Psychiatric/Behavioral: Negative for depression and memory loss. The patient does not have insomnia.     Past Medical History:  Diagnosis Date  . Aftercare following right hip joint replacement surgery   . Allergy    seasonal  . Anaphylaxis    Per [facility] New Patient Packet   . Arthritis   . Family history of adverse reaction to anesthesia    Past Surgical History:  Procedure Laterality Date  . APPENDECTOMY  1966   Per [facility] New Patient Packet   . TOTAL HIP ARTHROPLASTY Right 07/26/2020   Procedure: RIGHT TOTAL HIP ARTHROPLASTY ANTERIOR APPROACH;  Surgeon: Kathryne Hitch, MD;  Location: [facility] ORS;  Service: Orthopedics;  Laterality: Right;   Social History:   reports that he quit smoking about 16 years ago. His smoking use included cigarettes. He has a 45.00 pack-year smoking history. He has never used smokeless tobacco. He reports previous alcohol use. He reports that he does not use drugs.  Family History  Problem Relation Age of Onset  . Stroke Mother   . Diabetes Mother        borderline  . Alzheimer's disease Father   . Cancer Sister     Medications: Patient's Medications  New Prescriptions   No medications on file  Previous Medications   ASPIRIN-ACETAMINOPHEN-CAFFEINE (GOODYS EXTRA STRENGTH) 500-325-65 MG PACK    Take 1 packet by mouth 2 (two) times daily as needed.    NAPROXEN SODIUM (ALEVE) 220 MG TABLET  Take 220 mg by mouth daily as needed.    SILODOSIN (RAPAFLO) 8 MG CAPS CAPSULE    Take 8 mg by mouth daily. At night with food  Modified Medications   No medications on file  Discontinued Medications   ASPIRIN (ASPIRIN 81) 81 MG CHEWABLE TABLET    Chew 1 tablet (81 mg total) by mouth 2 (two) times daily after a meal.   CIPROFLOXACIN (CIPRO) 500 MG TABLET    Take 1 tablet (500 mg total) by mouth 2 (two) times daily.   EPINEPHRINE 0.3 MG/0.3 ML IJ SOAJ INJECTION    Inject 0.3 mLs (0.3 mg total) into the muscle as needed for  anaphylaxis.   FINASTERIDE (PROSCAR) 5 MG TABLET    Take 5 mg by mouth daily with supper.    METHOCARBAMOL (ROBAXIN) 500 MG TABLET    Take 1 tablet (500 mg total) by mouth every 6 (six) hours as needed for muscle spasms.   ONDANSETRON (ZOFRAN ODT) 4 MG DISINTEGRATING TABLET    Take 1 tablet (4 mg total) by mouth every 8 (eight) hours as needed for nausea or vomiting.   OXYCODONE (ROXICODONE) 5 MG IMMEDIATE RELEASE TABLET    Take 1-2 tablets (5-10 mg total) by mouth every 6 (six) hours as needed for severe pain.    Physical Exam:  Vitals:   09/04/20 1456  BP: 110/70  Pulse: 72  Temp: (!) 96.8 F (36 C)  TempSrc: Temporal  SpO2: 96%  Weight: 200 lb (90.7 kg)  Height: 6' (1.829 m)   Body mass index is 27.12 kg/m. Wt Readings from Last 3 Encounters:  09/04/20 200 lb (90.7 kg)  08/07/20 198 lb 12.8 oz (90.2 kg)  07/26/20 202 lb (91.6 kg)    Physical Exam Constitutional:      General: He is not in acute distress.    Appearance: He is well-developed. He is not diaphoretic.  HENT:     Head: Normocephalic and atraumatic.     Mouth/Throat:     Pharynx: No oropharyngeal exudate.  Eyes:     Conjunctiva/sclera: Conjunctivae normal.     Pupils: Pupils are equal, round, and reactive to light.  Cardiovascular:     Rate and Rhythm: Normal rate and regular rhythm.     Heart sounds: Normal heart sounds.  Pulmonary:     Effort: Pulmonary effort is normal.     Breath sounds: Normal breath sounds.  Abdominal:     General: Bowel sounds are normal.     Palpations: Abdomen is soft.  Musculoskeletal:        General: No tenderness.     Cervical back: Normal range of motion and neck supple.  Skin:    General: Skin is warm and dry.  Neurological:     Mental Status: He is alert and oriented to person, place, and time.  Psychiatric:        Mood and Affect: Mood normal.        Behavior: Behavior normal.     Labs reviewed: Basic Metabolic Panel: Recent Labs    05/30/20 0804  NA 137    K 4.3  CL 106  CO2 22  GLUCOSE 94  BUN 18  CREATININE 1.15  CALCIUM 9.2   Liver Function Tests: Recent Labs    05/30/20 0804  AST 14  ALT 14  BILITOT 0.8  PROT 6.9   No results for input(s): LIPASE, AMYLASE in the last 8760 hours. No results for input(s): AMMONIA in the last 8760 hours.  CBC: Recent Labs    07/23/20 1044  WBC 6.8  HGB 13.8  HCT 41.9  MCV 91.1  PLT 177   Lipid Panel: Recent Labs    11/29/19 0811 05/30/20 0804  CHOL 199 216*  HDL 41 41  LDLCALC 128* 148*  TRIG 186* 141  CHOLHDL 4.9 5.3*   TSH: No results for input(s): TSH in the last 8760 hours. A1C: No results found for: HGBA1C   Assessment/Plan 1. Need for influenza vaccination - Flu Vaccine QUAD High Dose(Fluad)  2. Need for vaccination with 13-polyvalent pneumococcal conjugate vaccine - Pneumococcal conjugate vaccine 13-valent  3. Hyperlipidemia LDL goal <100 Does not want to start cholesterol medication until weight is down with dietary modifications.  - Lipid Panel; Future - COMPLETE METABOLIC PANEL WITH GFR; Future  4. Unilateral primary osteoarthritis, right hip Improved post-op. No significant pain. Has follow up with orthopedics tomorrow.   5. Benign prostatic hyperplasia with urinary frequency -improved on rapaflo   6. Presbycusis of both ears -audiology referral.    Next appt: 4 months, labs prior  Mariadejesus Cade K. Biagio Borg  [facility] [phone]

## 2020-09-05 ENCOUNTER — Encounter: Payer: Self-pay | Admitting: Orthopaedic Surgery

## 2020-09-05 ENCOUNTER — Ambulatory Visit (INDEPENDENT_AMBULATORY_CARE_PROVIDER_SITE_OTHER): Payer: Medicare Other | Admitting: Orthopaedic Surgery

## 2020-09-05 DIAGNOSIS — Z96641 Presence of right artificial hip joint: Secondary | ICD-10-CM

## 2020-09-05 NOTE — Progress Notes (Signed)
The patient is a 64 year old gentleman who is now 6-week status post a right total hip arthroplasty.  His postoperative course was complicated by urologic issues.  He apparently does have a TURP in the near future.  He says the hip is doing well.  He has no complaints with it other than when he first gets up from sitting position he has just a little bit of pain when he walks up easily.  He is not needing an assistive device to get around and he reports better hip function and less stiffness.  On examination his right hip moves smoothly.  His leg lengths are equal.  He is not walking with any significant limp.  At this point we do not need to see him back for 6 months unless he is having issues.  At that visit we can have a AP pelvis and lateral of his right hip.

## 2020-09-23 ENCOUNTER — Ambulatory Visit (INDEPENDENT_AMBULATORY_CARE_PROVIDER_SITE_OTHER): Payer: Medicare Other | Admitting: Nurse Practitioner

## 2020-09-23 ENCOUNTER — Encounter: Payer: Self-pay | Admitting: Nurse Practitioner

## 2020-09-23 ENCOUNTER — Other Ambulatory Visit: Payer: Self-pay

## 2020-09-23 VITALS — BP 136/78 | HR 77 | Temp 96.8°F | Ht 72.0 in | Wt 196.8 lb

## 2020-09-23 DIAGNOSIS — R42 Dizziness and giddiness: Secondary | ICD-10-CM

## 2020-09-23 DIAGNOSIS — J3089 Other allergic rhinitis: Secondary | ICD-10-CM | POA: Diagnosis not present

## 2020-09-23 DIAGNOSIS — L853 Xerosis cutis: Secondary | ICD-10-CM | POA: Diagnosis not present

## 2020-09-23 NOTE — Progress Notes (Signed)
Careteam: Patient Care Team: Zachary Seller, NP as PCP - General (Geriatric Medicine)  PLACE OF SERVICE:  [facility]  Advanced Directive information Does Patient Have a Medical Advance Directive?: Yes, Does patient want to make changes to medical advance directive?: Yes (MAU/Ambulatory/Procedural Areas - Information given) (Paperwork provided at previous visit)  Allergies  Allergen Reactions  . Beef-Derived Products Anaphylaxis  . Other     Peanut Butter, Anaphylaxis   . Rapaflo [Silodosin]     Acute congestion and dizziness  . Penicillins Other (See Comments)    "childhood allergy", he doesn't know reaction     Chief Complaint  Patient presents with  . Medical Management of Chronic Issues    Routine follow. Patient c/o dizziness, itching on lower back (requesting exam of back), ringing in both ears (left is worse) and congestions. Patient associates congestion with rapaflo that he only took 4.5 doses of  and stopped. Discuss need for TD/Tdap     HPI: Patient is a 64 y.o. male for follow up  Unable to to tolerate alpha bockers- has tired multiple medications for bph. rapaflo was the last medication tired and felt drunk, very dizzy, sick. A day in a half it took him to get over it. Looked up side effects and nasal congestion and "stuff nose" has come on and not gone away.   Dizziness has persisted. Worse when he changes positions/bending over.  Also having ringing in ears When he coughs feels congestion in head and fullness.   Audiologist- needs hearing aides but he declines at this time.  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   Wants to drive and can not with the dizziness   Other than dizziness feels good.   As a child had bad allergies, did have to have shots, got better and then came back.  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   Just completed doxycycline for UTI pt reports he was asymptomatic   Review of Systems:  Review of Systems   Constitutional: Negative for chills and fever.  HENT: Positive for congestion. Negative for sinus pain and sore throat.   Cardiovascular: Negative for chest pain.  Genitourinary: Negative for dysuria and urgency.  Musculoskeletal: Negative for joint pain and myalgias.  Neurological: Positive for dizziness. Negative for headaches.  Endo/Heme/Allergies: Positive for environmental allergies.    Past Medical History:  Diagnosis Date  . Aftercare following right hip joint replacement surgery   . Allergy    seasonal  . Anaphylaxis    Per [facility] New Patient Packet   . Arthritis   . Family history of adverse reaction to anesthesia    Past Surgical History:  Procedure Laterality Date  . APPENDECTOMY  1966   Per [facility] New Patient Packet   . TOTAL HIP ARTHROPLASTY Right 07/26/2020   Procedure: RIGHT TOTAL HIP ARTHROPLASTY ANTERIOR APPROACH;  Surgeon: Kathryne Hitch, MD;  Location: [facility] ORS;  Service: Orthopedics;  Laterality: Right;   Social History:   reports that he quit smoking about 16 years ago. His smoking use included cigarettes. He has a 45.00 pack-year smoking history. He has never used smokeless tobacco. He reports previous alcohol use. He reports that he does not use drugs.  Family History  Problem Relation Age of Onset  . Stroke Mother   . Diabetes Mother        borderline  . Alzheimer's disease Father   . Cancer Sister  Medications: Patient's Medications  New Prescriptions   No medications on file  Previous Medications   No medications on file  Modified Medications   No medications on file  Discontinued Medications   ASPIRIN-ACETAMINOPHEN-CAFFEINE (GOODYS EXTRA STRENGTH) 500-325-65 MG PACK    Take 1 packet by mouth 2 (two) times daily as needed.    NAPROXEN SODIUM (ALEVE) 220 MG TABLET    Take 220 mg by mouth daily as needed.    SILODOSIN (RAPAFLO) 8 MG CAPS CAPSULE    Take 8 mg by mouth daily. At night with food     Physical Exam:  Vitals:   09/23/20 0913  BP: 136/78  Pulse: 77  Temp: (!) 96.8 F (36 C)  TempSrc: Temporal  SpO2: 97%  Weight: 196 lb 12.8 oz (89.3 kg)  Height: 6' (1.829 m)   Body mass index is 26.69 kg/m. Wt Readings from Last 3 Encounters:  09/23/20 196 lb 12.8 oz (89.3 kg)  09/04/20 200 lb (90.7 kg)  08/07/20 198 lb 12.8 oz (90.2 kg)    Physical Exam Constitutional:      General: He is not in acute distress.    Appearance: He is well-developed. He is not diaphoretic.  HENT:     Head: Normocephalic and atraumatic.     Right Ear: Ear canal and external ear normal. A middle ear effusion is present.     Left Ear: Ear canal and external ear normal. A middle ear effusion is present.     Mouth/Throat:     Pharynx: No oropharyngeal exudate.  Eyes:     Conjunctiva/sclera: Conjunctivae normal.     Pupils: Pupils are equal, round, and reactive to light.  Cardiovascular:     Rate and Rhythm: Normal rate and regular rhythm.     Heart sounds: Normal heart sounds.  Pulmonary:     Effort: Pulmonary effort is normal.     Breath sounds: Normal breath sounds.  Abdominal:     General: Bowel sounds are normal.     Palpations: Abdomen is soft.  Musculoskeletal:        General: No tenderness.     Cervical back: Normal range of motion and neck supple.  Skin:    General: Skin is warm and dry.  Neurological:     Mental Status: He is alert and oriented to person, place, and time.    Labs reviewed: Basic Metabolic Panel: Recent Labs    05/30/20 0804  NA 137  K 4.3  CL 106  CO2 22  GLUCOSE 94  BUN 18  CREATININE 1.15  CALCIUM 9.2   Liver Function Tests: Recent Labs    05/30/20 0804  AST 14  ALT 14  BILITOT 0.8  PROT 6.9   No results for input(s): LIPASE, AMYLASE in the last 8760 hours. No results for input(s): AMMONIA in the last 8760 hours. CBC: Recent Labs    07/23/20 1044  WBC 6.8  HGB 13.8  HCT 41.9  MCV 91.1  PLT 177   Lipid Panel: Recent  Labs    11/29/19 0811 05/30/20 0804  CHOL 199 216*  HDL 41 41  LDLCALC 128* 148*  TRIG 186* 141  CHOLHDL 4.9 5.3*   TSH: No results for input(s): TSH in the last 8760 hours. A1C: No results found for: HGBA1C   Assessment/Plan 1. Dizziness -encouraged slow change in position. with decrease hearing, likely due to fluid from allergies, will have him start xyzal daily with flonase nasal spray to treat allergies with saline  nasal wash qhs , if persist will refer to ENT for further evaluation.   2. Environmental and seasonal allergies To start xyzal daily with flonase 1 spray into bilateral nare BID To use saline nasal wash qhs   3. Dry skin Noted to back which is likely cause of itching To use cerave cream or eucerin cream to back immediately after shower/bath Avoid hot water No fragrances Keep hydrated with water  Next appt: 01/02/2021, sooner if needed  Shanda Bumps K. Biagio Borg  [facility] [phone]

## 2020-09-23 NOTE — Patient Instructions (Addendum)
Start xyxal daily  flonase 1 spray twice daily to both nares To use nasal saline wash daily in the evening  To use cerave cream or eucerin cream to back immediately after shower/bath Avoid hot water No fragrances Keep hydrated with water

## 2020-09-25 ENCOUNTER — Ambulatory Visit: Payer: Medicare Other | Admitting: Nurse Practitioner

## 2020-10-23 ENCOUNTER — Telehealth: Payer: Self-pay | Admitting: *Deleted

## 2020-10-23 NOTE — Telephone Encounter (Signed)
Patient called and stated that you had recommended him to take Xyzal.  Patient stated that he finally got some and was reading the box warnings and it states not to take if you have Bladder/Urinary Problems and he states that he does.   Wants to know if he should still take it.  Please Advise.

## 2020-10-23 NOTE — Telephone Encounter (Signed)
With his hx and the urinary retention he should avoid it, we can refer him to allergies if needed

## 2020-10-23 NOTE — Telephone Encounter (Signed)
Patient notified and agreed. Stated that he does not need an Allergist right now.

## 2020-11-13 ENCOUNTER — Ambulatory Visit (INDEPENDENT_AMBULATORY_CARE_PROVIDER_SITE_OTHER): Payer: Medicare Other | Admitting: Family

## 2020-11-13 ENCOUNTER — Other Ambulatory Visit: Payer: Self-pay

## 2020-11-13 ENCOUNTER — Encounter: Payer: Self-pay | Admitting: Family

## 2020-11-13 VITALS — BP 130/84 | HR 75 | Temp 97.5°F | Resp 16 | Ht 72.0 in | Wt 192.8 lb

## 2020-11-13 DIAGNOSIS — H6123 Impacted cerumen, bilateral: Secondary | ICD-10-CM | POA: Diagnosis not present

## 2020-11-13 DIAGNOSIS — R42 Dizziness and giddiness: Secondary | ICD-10-CM

## 2020-11-13 NOTE — Patient Instructions (Signed)
- Increase Fluid intake  - Fall precaution   Dizziness Dizziness is a common problem. It makes you feel unsteady or light-headed. You may feel like you are about to pass out (faint). Dizziness can lead to getting hurt if you stumble or fall. Dizziness can be caused by many things, including:  Medicines.  Not having enough water in your body (dehydration).  Illness. Follow these instructions at home: Eating and drinking   Drink enough fluid to keep your pee (urine) clear or pale yellow. This helps to keep you from getting dehydrated. Try to drink more clear fluids, such as water.  Do not drink alcohol.  Limit how much caffeine you drink or eat, if your doctor tells you to do that.  Limit how much salt (sodium) you drink or eat, if your doctor tells you to do that. Activity   Avoid making quick movements. ? When you stand up from sitting in a chair, steady yourself until you feel okay. ? In the morning, first sit up on the side of the bed. When you feel okay, stand slowly while you hold onto something. Do this until you know that your balance is fine.  If you need to stand in one place for a long time, move your legs often. Tighten and relax the muscles in your legs while you are standing.  Do not drive or use heavy machinery if you feel dizzy.  Avoid bending down if you feel dizzy. Place items in your home so you can reach them easily without leaning over. Lifestyle  Do not use any products that contain nicotine or tobacco, such as cigarettes and e-cigarettes. If you need help quitting, ask your doctor.  Try to lower your stress level. You can do this by using methods such as yoga or meditation. Talk with your doctor if you need help. General instructions  Watch your dizziness for any changes.  Take over-the-counter and prescription medicines only as told by your doctor. Talk with your doctor if you think that you are dizzy because of a medicine that you are taking.  Tell  a friend or a family member that you are feeling dizzy. If he or she notices any changes in your behavior, have this person call your doctor.  Keep all follow-up visits as told by your doctor. This is important. Contact a doctor if:  Your dizziness does not go away.  Your dizziness or light-headedness gets worse.  You feel sick to your stomach (nauseous).  You have trouble hearing.  You have new symptoms.  You are unsteady on your feet.  You feel like the room is spinning. Get help right away if:  You throw up (vomit) or have watery poop (diarrhea), and you cannot eat or drink anything.  You have trouble: ? Talking. ? Walking. ? Swallowing. ? Using your arms, hands, or legs.  You feel generally weak.  You are not thinking clearly, or you have trouble forming sentences. A friend or family member may notice this.  You have: ? Chest pain. ? Pain in your belly (abdomen). ? Shortness of breath. ? Sweating.  Your vision changes.  You are bleeding.  You have a very bad headache.  You have neck pain or a stiff neck.  You have a fever. These symptoms may be an emergency. Do not wait to see if the symptoms will go away. Get medical help right away. Call your local emergency services (911 in the [place] Do not drive yourself to the hospital.  Summary  Dizziness makes you feel unsteady or light-headed. You may feel like you are about to pass out (faint).  Drink enough fluid to keep your pee (urine) clear or pale yellow. Do not drink alcohol.  Avoid making quick movements if you feel dizzy.  Watch your dizziness for any changes. This information is not intended to replace advice given to you by your health care provider. Make sure you discuss any questions you have with your health care provider. Document Revised: 10/29/2017 Document Reviewed: 11/12/2016 Elsevier Patient Education  2020 [employer].

## 2020-11-13 NOTE — Progress Notes (Signed)
Provider: Devyon Keator FNP-C  Zachary Seller, NP  Patient Care Team: Zachary Seller, NP as PCP - General (Geriatric Medicine)  Extended Emergency Contact Information Primary Emergency Contact: Zachary [place] Mobile Phone: [phone] Relation: Daughter  Code Status:  Full Code  Goals of care: Advanced Directive information Advanced Directives 11/13/2020  Does Patient Have a Medical Advance Directive? No  Does patient want to make changes to medical advance directive? No - Patient declined  Would patient like information on creating a medical advance directive? -     Chief Complaint  Patient presents with  . Acute Visit    Complains of dizziness.    HPI:  Pt is a 65 y.o. male seen today for an acute visit for evaluation of dizziness.He state dizziness is constant.He was seen by PCP 09/23/2020 he was started on  Xyzal but read the back says don't take with bladder issues.So he did not take it.Dizziness worst with change position. He took a loratadine last night due to worsening of symptoms.Much better this morning. He denies any nausea,vomiting or vision changes.    Past Medical History:  Diagnosis Date  . Aftercare following right hip joint replacement surgery   . Allergy    seasonal  . Anaphylaxis    Per [facility] New Patient Packet   . Arthritis   . Family history of adverse reaction to anesthesia    Past Surgical History:  Procedure Laterality Date  . APPENDECTOMY  1966   Per [facility] New Patient Packet   . TOTAL HIP ARTHROPLASTY Right 07/26/2020   Procedure: RIGHT TOTAL HIP ARTHROPLASTY ANTERIOR APPROACH;  Surgeon: Kathryne Hitch, MD;  Location: [facility];  Service: Orthopedics;  Laterality: Right;    Allergies  Allergen Reactions  . Beef-Derived Products Anaphylaxis  . Other     Peanut Butter, Anaphylaxis   . Rapaflo [Silodosin]     Acute congestion and dizziness  . Penicillins Other (See  Comments)    "childhood allergy", he doesn't know reaction     No outpatient encounter medications on file as of 11/13/2020.   No facility-administered encounter medications on file as of 11/13/2020.    Review of Systems  Constitutional: Negative for appetite change, chills, fatigue and fever.  HENT: Negative for congestion, rhinorrhea, sinus pressure, sinus pain, sneezing and sore throat.   Eyes: Negative for pain, redness and itching.       Can't  see or focus when having episodes of dizziness   Respiratory: Negative for cough, chest tightness, shortness of breath and wheezing.   Cardiovascular: Negative for chest pain, palpitations and leg swelling.  Skin: Negative for color change, pallor and rash.  Neurological: Positive for dizziness. Negative for speech difficulty, weakness, light-headedness and headaches.    Immunization History  Administered Date(s) Administered  . Fluad Quad(high Dose 65+) 09/04/2020  . Influenza Split 08/18/2019  . Influenza, High Dose Seasonal PF 09/04/2020  . Influenza,inj,Quad PF,6+ Mos 08/18/2019  . Moderna Sars-Covid-2 Vaccination 05/20/2020, 06/19/2020  . Pneumococcal Conjugate-13 09/04/2020   Pertinent  Health Maintenance Due  Topic Date Due  . PNA vac Low Risk Adult (2 of 2 - PPSV23) 09/04/2021  . INFLUENZA VACCINE  Completed   Fall Risk  11/13/2020 09/23/2020 09/04/2020 06/18/2020 06/03/2020  Falls in the past year? 0 0 0 0 0  Number falls in past yr: 0 0 0 0 0  Injury with Fall? 0 0 0 0 0   Functional Status Survey:  Vitals:   11/13/20 0858  BP: 130/84  Pulse: 75  Resp: 16  Temp: (!) 97.5 F (36.4 C)  SpO2: 99%  Weight: 192 lb 12.8 oz (87.5 kg)  Height: 6' (1.829 m)   Body mass index is 26.15 kg/m. Physical Exam Vitals reviewed.  Constitutional:      General: He is not in acute distress.    Appearance: He is not ill-appearing.  HENT:     Head: Normocephalic.     Right Ear: There is impacted cerumen.     Left Ear: There is  impacted cerumen.     Nose: Nose normal. No congestion or rhinorrhea.     Mouth/Throat:     Mouth: Mucous membranes are moist.     Pharynx: Oropharynx is clear. No oropharyngeal exudate or posterior oropharyngeal erythema.  Eyes:     General: No scleral icterus.       Right eye: No discharge.        Left eye: No discharge.     Extraocular Movements: Extraocular movements intact.     Conjunctiva/sclera: Conjunctivae normal.     Pupils: Pupils are equal, round, and reactive to light.  Neck:     Vascular: No carotid bruit.  Cardiovascular:     Rate and Rhythm: Normal rate and regular rhythm.     Pulses: Normal pulses.     Heart sounds: Normal heart sounds. No murmur heard. No friction rub.  Pulmonary:     Effort: Pulmonary effort is normal. No respiratory distress.     Breath sounds: Normal breath sounds. No wheezing, rhonchi or rales.  Chest:     Chest wall: No tenderness.  Abdominal:     General: Bowel sounds are normal. There is no distension.     Palpations: Abdomen is soft. There is no mass.     Tenderness: There is no abdominal tenderness. There is no right CVA tenderness, left CVA tenderness, guarding or rebound.  Musculoskeletal:        General: No swelling or tenderness. Normal range of motion.     Cervical back: Normal range of motion. No rigidity or tenderness.     Right lower leg: No edema.     Left lower leg: No edema.  Lymphadenopathy:     Cervical: No cervical adenopathy.  Skin:    General: Skin is warm and dry.     Coloration: Skin is not pale.     Findings: No bruising, erythema or rash.  Neurological:     Mental Status: He is alert and oriented to person, place, and time.     Cranial Nerves: No cranial nerve deficit.     Motor: No weakness.     Coordination: Coordination normal.     Gait: Gait normal.  Psychiatric:        Mood and Affect: Mood normal.        Behavior: Behavior normal.        Thought Content: Thought content normal.        Judgment:  Judgment normal.     Labs reviewed: Recent Labs    05/30/20 0804  NA 137  K 4.3  CL 106  CO2 22  GLUCOSE 94  BUN 18  CREATININE 1.15  CALCIUM 9.2   Recent Labs    05/30/20 0804  AST 14  ALT 14  BILITOT 0.8  PROT 6.9   Recent Labs    07/23/20 1044  WBC 6.8  HGB 13.8  HCT 41.9  MCV 91.1  PLT  177   No results found for: TSH No results found for: HGBA1C Lab Results  Component Value Date   CHOL 216 (H) 05/30/2020   HDL 41 05/30/2020   LDLCALC 148 (H) 05/30/2020   TRIG 141 05/30/2020   CHOLHDL 5.3 (H) 05/30/2020    Significant Diagnostic Results in last 30 days:  No results found.  Assessment/Plan 1. Dizziness Worst with position changes.B/p checking lying,sitting and standing no orthostatic noted.Unclear etiology but could be related to cerumen impaction and Allergies too. Consider referral to PT if not resolved.  - Fall and safety precaution advised. - Encouraged to increase water intake to at least 6-8 glasses daily  - Additional Education material provided on AVS   - Ambulatory referral to ENT  2. Bilateral impacted cerumen TM not visualized.will refer to ENT for cerumen remove and evaluation of dizziness. - Ambulatory referral to ENT  Family/ staff Communication: Reviewed plan of care with patient  Labs/tests ordered: None   Next Appointment: As needed if symptoms worsen or fail to improve.   Zachary Bookman, NP

## 2020-12-26 HISTORY — PX: TRANSURETHRAL RESECTION OF PROSTATE: SHX73

## 2021-01-02 ENCOUNTER — Other Ambulatory Visit: Payer: Self-pay

## 2021-01-02 ENCOUNTER — Other Ambulatory Visit: Payer: Medicare Other

## 2021-01-02 DIAGNOSIS — E785 Hyperlipidemia, unspecified: Secondary | ICD-10-CM

## 2021-01-02 LAB — COMPLETE METABOLIC PANEL WITH GFR
AG Ratio: 1.7 (calc) (ref 1.0–2.5)
ALT: 9 U/L (ref 9–46)
AST: 9 U/L — ABNORMAL LOW (ref 10–35)
Albumin: 3.9 g/dL (ref 3.6–5.1)
Alkaline phosphatase (APISO): 88 U/L (ref 35–144)
BUN: 16 mg/dL (ref 7–25)
CO2: 23 mmol/L (ref 20–32)
Calcium: 8.9 mg/dL (ref 8.6–10.3)
Chloride: 107 mmol/L (ref 98–110)
Creat: 1.15 mg/dL (ref 0.70–1.25)
GFR, Est African American: 77 mL/min/{1.73_m2} (ref >=60)
GFR, Est Non African American: 66 mL/min/{1.73_m2} (ref >=60)
Globulin: 2.3 g/dL (calc) (ref 1.9–3.7)
Glucose, Bld: 92 mg/dL (ref 65–99)
Potassium: 4.4 mmol/L (ref 3.5–5.3)
Sodium: 138 mmol/L (ref 135–146)
Total Bilirubin: 0.4 mg/dL (ref 0.2–1.2)
Total Protein: 6.2 g/dL (ref 6.1–8.1)

## 2021-01-02 LAB — LIPID PANEL
Cholesterol: 195 mg/dL (ref <200)
HDL: 39 mg/dL — ABNORMAL LOW (ref >=40)
LDL Cholesterol (Calc): 131 mg/dL (calc) — ABNORMAL HIGH
Non-HDL Cholesterol (Calc): 156 mg/dL (calc) — ABNORMAL HIGH (ref <130)
Total CHOL/HDL Ratio: 5 (calc) — ABNORMAL HIGH (ref <5.0)
Triglycerides: 139 mg/dL (ref <150)

## 2021-01-06 ENCOUNTER — Ambulatory Visit (INDEPENDENT_AMBULATORY_CARE_PROVIDER_SITE_OTHER): Payer: Medicare Other | Admitting: Nurse Practitioner

## 2021-01-06 ENCOUNTER — Encounter: Payer: Self-pay | Admitting: Nurse Practitioner

## 2021-01-06 ENCOUNTER — Other Ambulatory Visit: Payer: Self-pay

## 2021-01-06 VITALS — BP 112/68 | HR 84 | Temp 96.8°F | Ht 72.0 in | Wt 200.0 lb

## 2021-01-06 DIAGNOSIS — N401 Enlarged prostate with lower urinary tract symptoms: Secondary | ICD-10-CM

## 2021-01-06 DIAGNOSIS — E663 Overweight: Secondary | ICD-10-CM

## 2021-01-06 DIAGNOSIS — Z6827 Body mass index (BMI) 27.0-27.9, adult: Secondary | ICD-10-CM | POA: Diagnosis not present

## 2021-01-06 DIAGNOSIS — M1611 Unilateral primary osteoarthritis, right hip: Secondary | ICD-10-CM

## 2021-01-06 DIAGNOSIS — E785 Hyperlipidemia, unspecified: Secondary | ICD-10-CM

## 2021-01-06 DIAGNOSIS — H6122 Impacted cerumen, left ear: Secondary | ICD-10-CM

## 2021-01-06 NOTE — Progress Notes (Signed)
Careteam: Patient Care Team: [name], NP as PCP - General (Geriatric Medicine)  PLACE OF SERVICE:  [facility]  Advanced Directive information Does Patient Have a Medical Advance Directive?: No, Does patient want to make changes to medical advance directive?: Yes (MAU/Ambulatory/Procedural Areas - Information given) (Given at previous visit)  Allergies  Allergen Reactions  . Beef-Derived Products Anaphylaxis  . Other     Peanut Butter, Anaphylaxis   . Rapaflo [Silodosin]     Acute congestion and dizziness  . Penicillins Other (See Comments)    "childhood allergy", he doesn't know reaction     Chief Complaint  Patient presents with  . Medical Management of Chronic Issues    4 month follow-up and discuss labs (copy printed)  Discuss need for booster and Td/tdap      HPI: Patient is a 64 y.o. male for routine follow up  Had TURP on 12/26/20 by Dr Logan Bores. Still having some pink tinged urine but overall doing well post op.  Hip is doing well post-op. No pain. Almost back to base line.   No currently taking any medication.   Review of Systems:  Review of Systems  Constitutional: Negative for chills, fever and weight loss.  HENT: Negative for tinnitus.   Respiratory: Negative for cough, sputum production and shortness of breath.   Cardiovascular: Negative for chest pain, palpitations and leg swelling.  Gastrointestinal: Negative for abdominal pain, constipation, diarrhea and heartburn.  Genitourinary: Negative for dysuria, frequency and urgency.  Musculoskeletal: Negative for back pain, falls, joint pain and myalgias.  Skin: Negative.   Neurological: Negative for dizziness and headaches.  Psychiatric/Behavioral: Negative for depression and memory loss. The patient does not have insomnia.     Past Medical History:  Diagnosis Date  . Aftercare following right hip joint replacement surgery   . Allergy    seasonal  . Anaphylaxis    Per [facility]  New Patient Packet   . Arthritis   . Family history of adverse reaction to anesthesia    Past Surgical History:  Procedure Laterality Date  . APPENDECTOMY  1966   Per [facility] New Patient Packet   . TOTAL HIP ARTHROPLASTY Right 07/26/2020   Procedure: RIGHT TOTAL HIP ARTHROPLASTY ANTERIOR APPROACH;  Surgeon: Kathryne Hitch, MD;  Location: [facility];  Service: Orthopedics;  Laterality: Right;  . TRANSURETHRAL RESECTION OF PROSTATE  12/26/2020   Social History:   reports that he quit smoking about 17 years ago. His smoking use included cigarettes. He has a 45.00 pack-year smoking history. He has never used smokeless tobacco. He reports current alcohol use. He reports that he does not use drugs.  Family History  Problem Relation Age of Onset  . Stroke Mother   . Diabetes Mother        borderline  . Alzheimer's disease Father   . Cancer Sister     Medications: Patient's Medications   No medications on file    Physical Exam:  Vitals:   01/06/21 0949  BP: 112/68  Pulse: 84  Temp: (!) 96.8 F (36 C)  SpO2: 97%  Weight: 200 lb (90.7 kg)  Height: 6' (1.829 m)   Body mass index is 27.12 kg/m. Wt Readings from Last 3 Encounters:  01/06/21 200 lb (90.7 kg)  11/13/20 192 lb 12.8 oz (87.5 kg)  09/23/20 196 lb 12.8 oz (89.3 kg)    Physical Exam Constitutional:      General: He is not in acute  distress.    Appearance: He is well-developed and well-nourished. He is not diaphoretic.  HENT:     Head: Normocephalic and atraumatic.     Mouth/Throat:     Mouth: Oropharynx is clear and moist.     Pharynx: No oropharyngeal exudate.  Eyes:     Extraocular Movements: EOM normal.     Conjunctiva/sclera: Conjunctivae normal.     Pupils: Pupils are equal, round, and reactive to light.  Cardiovascular:     Rate and Rhythm: Normal rate and regular rhythm.     Heart sounds: Normal heart sounds.  Pulmonary:     Effort: Pulmonary effort is normal.     Breath sounds:  Normal breath sounds.  Abdominal:     General: Bowel sounds are normal.     Palpations: Abdomen is soft.  Musculoskeletal:        General: No tenderness or edema.     Cervical back: Normal range of motion and neck supple.  Skin:    General: Skin is warm and dry.  Neurological:     Mental Status: He is alert and oriented to person, place, and time.  Psychiatric:        Mood and Affect: Mood and affect normal.     Labs reviewed: Basic Metabolic Panel: Recent Labs    05/30/20 0804 01/02/21 0818  NA 137 138  K 4.3 4.4  CL 106 107  CO2 22 23  GLUCOSE 94 92  BUN 18 16  CREATININE 1.15 1.15  CALCIUM 9.2 8.9   Liver Function Tests: Recent Labs    05/30/20 0804 01/02/21 0818  AST 14 9*  ALT 14 9  BILITOT 0.8 0.4  PROT 6.9 6.2   No results for input(s): LIPASE, AMYLASE in the last 8760 hours. No results for input(s): AMMONIA in the last 8760 hours. CBC: Recent Labs    07/23/20 1044  WBC 6.8  HGB 13.8  HCT 41.9  MCV 91.1  PLT 177   Lipid Panel: Recent Labs    05/30/20 0804 01/02/21 0818  CHOL 216* 195  HDL 41 39*  LDLCALC 148* 131*  TRIG 141 139  CHOLHDL 5.3* 5.0*   TSH: No results for input(s): TSH in the last 8760 hours. A1C: No results found for: HGBA1C   Assessment/Plan 1. Unilateral primary osteoarthritis, right hip -doing well post-op. Pain and mobility improving. - COMPLETE METABOLIC PANEL WITH GFR; Future - CBC with Differential/Platelet; Future  2. Overweight with body mass index (BMI) 25.0-29.9 Stable, encouraged healthy eating habits and keeping active.   3. Body mass index 27.0-27.9, adult Noted today.   4. Hyperlipidemia LDL goal <100 -improved LDL but not at goal. Continue to work on dietary modifications. - Lipid Panel; Future - COMPLETE METABOLIC PANEL WITH GFR; Future - CBC with Differential/Platelet; Future  5. Impacted cerumen of left ear Ear lavage performed and removed. Pt tolerated well.   6. BPH S/p TURP, doing  well post-op. Followed by urology   Next appt: 1 year with labs prior.  Janene Harvey. Biagio Borg  [facility] [phone]

## 2021-01-06 NOTE — Patient Instructions (Addendum)
TDAP and COVID booster recommended - to get at your local pharmacy.      Heart-Healthy Eating Plan Many factors influence your heart (coronary) health, including eating and exercise habits. Coronary risk increases with abnormal blood fat (lipid) levels. Heart-healthy meal planning includes limiting unhealthy fats, increasing healthy fats, and making other diet and lifestyle changes.   What are tips for following this plan? Cooking Cook foods using methods other than frying. Baking, boiling, grilling, and broiling are all good options. Other ways to reduce fat include:  Removing the skin from poultry.  Removing all visible fats from meats.  Steaming vegetables in water or broth. Meal planning  At meals, imagine dividing your plate into fourths: ? Fill one-half of your plate with vegetables and green salads. ? Fill one-fourth of your plate with whole grains. ? Fill one-fourth of your plate with lean protein foods.  Eat 4-5 servings of vegetables per day. One serving equals 1 cup raw or cooked vegetable, or 2 cups raw leafy greens.  Eat 4-5 servings of fruit per day. One serving equals 1 medium whole fruit,  cup dried fruit,  cup fresh, frozen, or canned fruit, or  cup 100% fruit juice.  Eat more foods that contain soluble fiber. Examples include apples, broccoli, carrots, beans, peas, and barley. Aim to get 25-30 g of fiber per day.  Increase your consumption of legumes, nuts, and seeds to 4-5 servings per week. One serving of dried beans or legumes equals  cup cooked, 1 serving of nuts is  cup, and 1 serving of seeds equals 1 tablespoon.   Fats  Choose healthy fats more often. Choose monounsaturated and polyunsaturated fats, such as olive and canola oils, flaxseeds, walnuts, almonds, and seeds.  Eat more omega-3 fats. Choose salmon, mackerel, sardines, tuna, flaxseed oil, and ground flaxseeds. Aim to eat fish at least 2 times each week.  Check food labels carefully to  identify foods with trans fats or high amounts of saturated fat.  Limit saturated fats. These are found in animal products, such as meats, butter, and cream. Plant sources of saturated fats include palm oil, palm kernel oil, and coconut oil.  Avoid foods with partially hydrogenated oils in them. These contain trans fats. Examples are stick margarine, some tub margarines, cookies, crackers, and other baked goods.  Avoid fried foods. General information  Eat more home-cooked food and less restaurant, buffet, and fast food.  Limit or avoid alcohol.  Limit foods that are high in starch and sugar.  Lose weight if you are overweight. Losing just 5-10% of your body weight can help your overall health and prevent diseases such as diabetes and heart disease.  Monitor your salt (sodium) intake, especially if you have high blood pressure. Talk with your health care provider about your sodium intake.  Try to incorporate more vegetarian meals weekly. What foods can I eat? Fruits All fresh, canned (in natural juice), or frozen fruits. Vegetables Fresh or frozen vegetables (raw, steamed, roasted, or grilled). Green salads. Grains Most grains. Choose whole wheat and whole grains most of the time. Rice and pasta, including brown rice and pastas made with whole wheat. Meats and other proteins Lean, well-trimmed beef, veal, pork, and lamb. Chicken and [place] without skin. All fish and shellfish. Wild duck, rabbit, pheasant, and venison. Egg whites or low-cholesterol egg substitutes. Dried beans, peas, lentils, and tofu. Seeds and most nuts. Dairy Low-fat or nonfat cheeses, including ricotta and mozzarella. Skim or 1% milk (liquid, powdered, or evaporated). Buttermilk  made with low-fat milk. Nonfat or low-fat yogurt. Fats and oils Non-hydrogenated (trans-free) margarines. Vegetable oils, including soybean, sesame, sunflower, olive, peanut, safflower, corn, canola, and cottonseed. Salad dressings or  mayonnaise made with a vegetable oil. Beverages Water (mineral or sparkling). Coffee and tea. Diet carbonated beverages. Sweets and desserts Sherbet, gelatin, and fruit ice. Small amounts of dark chocolate. Limit all sweets and desserts. Seasonings and condiments All seasonings and condiments. The items listed above may not be a complete list of foods and beverages you can eat. Contact a dietitian for more options. What foods are not recommended? Fruits Canned fruit in heavy syrup. Fruit in cream or butter sauce. Fried fruit. Limit coconut. Vegetables Vegetables cooked in cheese, cream, or butter sauce. Fried vegetables. Grains Breads made with saturated or trans fats, oils, or whole milk. Croissants. Sweet rolls. Donuts. High-fat crackers, such as cheese crackers. Meats and other proteins Fatty meats, such as hot dogs, ribs, sausage, bacon, rib-eye roast or steak. High-fat deli meats, such as salami and bologna. Caviar. Domestic duck and goose. Organ meats, such as liver. Dairy Cream, sour cream, cream cheese, and creamed cottage cheese. Whole milk cheeses. Whole or 2% milk (liquid, evaporated, or condensed). Whole buttermilk. Cream sauce or high-fat cheese sauce. Whole-milk yogurt. Fats and oils Meat fat, or shortening. Cocoa butter, hydrogenated oils, palm oil, coconut oil, palm kernel oil. Solid fats and shortenings, including bacon fat, salt pork, lard, and butter. Nondairy cream substitutes. Salad dressings with cheese or sour cream. Beverages Regular sodas and any drinks with added sugar. Sweets and desserts Frosting. Pudding. Cookies. Cakes. Pies. Milk chocolate or white chocolate. Buttered syrups. Full-fat ice cream or ice cream drinks. The items listed above may not be a complete list of foods and beverages to avoid. Contact a dietitian for more information. Summary  Heart-healthy meal planning includes limiting unhealthy fats, increasing healthy fats, and making other diet and  lifestyle changes.  Lose weight if you are overweight. Losing just 5-10% of your body weight can help your overall health and prevent diseases such as diabetes and heart disease.  Focus on eating a balance of foods, including fruits and vegetables, low-fat or nonfat dairy, lean protein, nuts and legumes, whole grains, and heart-healthy oils and fats. This information is not intended to replace advice given to you by your health care provider. Make sure you discuss any questions you have with your health care provider. Document Revised: 12/03/2017 Document Reviewed: 12/03/2017 Elsevier Patient Education  2021 [employer].

## 2021-03-06 ENCOUNTER — Ambulatory Visit (INDEPENDENT_AMBULATORY_CARE_PROVIDER_SITE_OTHER): Payer: Medicare Other | Admitting: Orthopaedic Surgery

## 2021-03-06 ENCOUNTER — Ambulatory Visit (INDEPENDENT_AMBULATORY_CARE_PROVIDER_SITE_OTHER): Payer: Medicare Other

## 2021-03-06 ENCOUNTER — Encounter: Payer: Self-pay | Admitting: Orthopaedic Surgery

## 2021-03-06 DIAGNOSIS — Z96641 Presence of right artificial hip joint: Secondary | ICD-10-CM

## 2021-03-06 NOTE — Progress Notes (Signed)
The patient is now 7 months status post a right total hip arthroplasty.  He says he has good motion and strength.  He does report some numbness around the incision but overall he is doing well.  He is 65 years old very active.  He has occasional thigh pain.  He walks without assist device.  On exam his right hip moves smoothly and fluidly.  His left nonoperative hip also move smoothly and fluidly.  His leg lengths are equal.  An AP pelvis and lateral of the right hip shows a well-seated total hip arthroplasty on the right side with no complicating features.  At this point follow-up can be as needed for the right hip.  If he develops any issues with it at all he knows to give [place] a call.  All questions and concerns were answered and addressed.

## 2021-06-20 ENCOUNTER — Other Ambulatory Visit: Payer: Self-pay

## 2021-06-20 ENCOUNTER — Telehealth: Payer: Self-pay

## 2021-06-20 ENCOUNTER — Ambulatory Visit (INDEPENDENT_AMBULATORY_CARE_PROVIDER_SITE_OTHER): Payer: Medicare Other | Admitting: Nurse Practitioner

## 2021-06-20 ENCOUNTER — Encounter: Payer: Self-pay | Admitting: Nurse Practitioner

## 2021-06-20 DIAGNOSIS — Z0000 Encounter for general adult medical examination without abnormal findings: Secondary | ICD-10-CM | POA: Diagnosis not present

## 2021-06-20 NOTE — Patient Instructions (Signed)
[name] , Thank you for taking time to come for your Medicare Wellness Visit. I appreciate your ongoing commitment to your health goals. Please review the following plan we discussed and let me know if I can assist you in the future.   Screening recommendations/referrals: Colonoscopy up to date - cologuard Recommended yearly ophthalmology/optometry visit for glaucoma screening and checkup Recommended yearly dental visit for hygiene and checkup  Vaccinations: Influenza vaccine RECOMMENDED TO GET AT LOCAL PHARMACY Pneumococcal vaccine recommended to get at next office Tdap vaccine RECOMMENDED to get at local pharmacy  Shingles vaccine- RECOMMENDED to get at local pharmacy COVID BOOSTER- RECOMMENDED to get at local pharmacy      Advanced directives: recommend completing, can complete MOST together in office   Conditions/risks identified: advanced age,   Next appointment: yearly for AWV  Preventive Care 90 Years and Older, Male Preventive care refers to lifestyle choices and visits with your health care provider that can promote health and wellness. What does preventive care include? A yearly physical exam. This is also called an annual well check. Dental exams once or twice a year. Routine eye exams. Ask your health care provider how often you should have your eyes checked. Personal lifestyle choices, including: Daily care of your teeth and gums. Regular physical activity. Eating a healthy diet. Avoiding tobacco and drug use. Limiting alcohol use. Practicing safe sex. Taking low doses of aspirin every day. Taking vitamin and mineral supplements as recommended by your health care provider. What happens during an annual well check? The services and screenings done by your health care provider during your annual well check will depend on your age, overall health, lifestyle risk factors, and family history of disease. Counseling  Your health care provider may ask you questions  about your: Alcohol use. Tobacco use. Drug use. Emotional well-being. Home and relationship well-being. Sexual activity. Eating habits. History of falls. Memory and ability to understand (cognition). Work and work Astronomer. Screening  You may have the following tests or measurements: Height, weight, and BMI. Blood pressure. Lipid and cholesterol levels. These may be checked every 5 years, or more frequently if you are over 51 years old. Skin check. Lung cancer screening. You may have this screening every year starting at age 12 if you have a 30-pack-year history of smoking and currently smoke or have quit within the past 15 years. Fecal occult blood test (FOBT) of the stool. You may have this test every year starting at age 66. Flexible sigmoidoscopy or colonoscopy. You may have a sigmoidoscopy every 5 years or a colonoscopy every 10 years starting at age 61. Prostate cancer screening. Recommendations will vary depending on your family history and other risks. Hepatitis C blood test. Hepatitis B blood test. Sexually transmitted disease (STD) testing. Diabetes screening. This is done by checking your blood sugar (glucose) after you have not eaten for a while (fasting). You may have this done every 1-3 years. Abdominal aortic aneurysm (AAA) screening. You may need this if you are a current or former smoker. Osteoporosis. You may be screened starting at age 7 if you are at high risk. Talk with your health care provider about your test results, treatment options, and if necessary, the need for more tests. Vaccines  Your health care provider may recommend certain vaccines, such as: Influenza vaccine. This is recommended every year. Tetanus, diphtheria, and acellular pertussis (Tdap, Td) vaccine. You may need a Td booster every 10 years. Zoster vaccine. You may need this after age 90.  Pneumococcal 13-valent conjugate (PCV13) vaccine. One dose is recommended after age 64. Pneumococcal  polysaccharide (PPSV23) vaccine. One dose is recommended after age 49. Talk to your health care provider about which screenings and vaccines you need and how often you need them. This information is not intended to replace advice given to you by your health care provider. Make sure you discuss any questions you have with your health care provider. Document Released: 11/22/2015 Document Revised: 07/15/2016 Document Reviewed: 08/27/2015 Elsevier Interactive Patient Education  2017 [place] Prevention in the Home Falls can cause injuries. They can happen to people of all ages. There are many things you can do to make your home safe and to help prevent falls. What can I do on the outside of my home? Regularly fix the edges of walkways and driveways and fix any cracks. Remove anything that might make you trip as you walk through a door, such as a raised step or threshold. Trim any bushes or trees on the path to your home. Use bright outdoor lighting. Clear any walking paths of anything that might make someone trip, such as rocks or tools. Regularly check to see if handrails are loose or broken. Make sure that both sides of any steps have handrails. Any raised decks and porches should have guardrails on the edges. Have any leaves, snow, or ice cleared regularly. Use sand or salt on walking paths during winter. Clean up any spills in your garage right away. This includes oil or grease spills. What can I do in the bathroom? Use night lights. Install grab bars by the toilet and in the tub and shower. Do not use towel bars as grab bars. Use non-skid mats or decals in the tub or shower. If you need to sit down in the shower, use a plastic, non-slip stool. Keep the floor dry. Clean up any water that spills on the floor as soon as it happens. Remove soap buildup in the tub or shower regularly. Attach bath mats securely with double-sided non-slip rug tape. Do not have throw rugs and other  things on the floor that can make you trip. What can I do in the bedroom? Use night lights. Make sure that you have a light by your bed that is easy to reach. Do not use any sheets or blankets that are too big for your bed. They should not hang down onto the floor. Have a firm chair that has side arms. You can use this for support while you get dressed. Do not have throw rugs and other things on the floor that can make you trip. What can I do in the kitchen? Clean up any spills right away. Avoid walking on wet floors. Keep items that you use a lot in easy-to-reach places. If you need to reach something above you, use a strong step stool that has a grab bar. Keep electrical cords out of the way. Do not use floor polish or wax that makes floors slippery. If you must use wax, use non-skid floor wax. Do not have throw rugs and other things on the floor that can make you trip. What can I do with my stairs? Do not leave any items on the stairs. Make sure that there are handrails on both sides of the stairs and use them. Fix handrails that are broken or loose. Make sure that handrails are as long as the stairways. Check any carpeting to make sure that it is firmly attached to the stairs. Fix any  carpet that is loose or worn. Avoid having throw rugs at the top or bottom of the stairs. If you do have throw rugs, attach them to the floor with carpet tape. Make sure that you have a light switch at the top of the stairs and the bottom of the stairs. If you do not have them, ask someone to add them for you. What else can I do to help prevent falls? Wear shoes that: Do not have high heels. Have rubber bottoms. Are comfortable and fit you well. Are closed at the toe. Do not wear sandals. If you use a stepladder: Make sure that it is fully opened. Do not climb a closed stepladder. Make sure that both sides of the stepladder are locked into place. Ask someone to hold it for you, if possible. Clearly  mark and make sure that you can see: Any grab bars or handrails. First and last steps. Where the edge of each step is. Use tools that help you move around (mobility aids) if they are needed. These include: Canes. Walkers. Scooters. Crutches. Turn on the lights when you go into a dark area. Replace any light bulbs as soon as they burn out. Set up your furniture so you have a clear path. Avoid moving your furniture around. If any of your floors are uneven, fix them. If there are any pets around you, be aware of where they are. Review your medicines with your doctor. Some medicines can make you feel dizzy. This can increase your chance of falling. Ask your doctor what other things that you can do to help prevent falls. This information is not intended to replace advice given to you by your health care provider. Make sure you discuss any questions you have with your health care provider. Document Released: 08/22/2009 Document Revised: 04/02/2016 Document Reviewed: 11/30/2014 Elsevier Interactive Patient Education  2017 [employer].

## 2021-06-20 NOTE — Progress Notes (Signed)
Subjective:   Zachary Best is a 65 y.o. male who presents for Medicare Annual/Subsequent preventive examination.  Review of Systems     Cardiac Risk Factors include: advanced age (>36men, >35 women);male gender     Objective:    There were no vitals filed for this visit. There is no height or weight on file to calculate BMI.  Advanced Directives 06/20/2021 01/06/2021 11/13/2020 09/23/2020 08/07/2020 07/23/2020 06/03/2020  Does Patient Have a Medical Advance Directive? No No No Yes No No No  Does patient want to make changes to medical advance directive? - Yes (MAU/Ambulatory/Procedural Areas - Information given) No - Patient declined Yes (MAU/Ambulatory/Procedural Areas - Information given) - - -  Would patient like information on creating a medical advance directive? - - - - - - Yes (MAU/Ambulatory/Procedural Areas - Information given)    Current Medications (verified) No outpatient encounter medications on file as of 06/20/2021.   No facility-administered encounter medications on file as of 06/20/2021.    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    Allergy    seasonal   Anaphylaxis    Per [facility] New Patient Packet    Arthritis    Family history of adverse reaction to anesthesia    Past Surgical History:  Procedure Laterality Date   APPENDECTOMY  1966   Per [facility] New Patient Packet    TOTAL HIP ARTHROPLASTY Right 07/26/2020   Procedure: RIGHT TOTAL HIP ARTHROPLASTY ANTERIOR APPROACH;  Surgeon: Kathryne Hitch, MD;  Location: [facility];  Service: Orthopedics;  Laterality: Right;   TRANSURETHRAL RESECTION OF PROSTATE  12/26/2020   Family History  Problem Relation Age of Onset   Stroke Mother    Diabetes Mother        borderline   Alzheimer's disease Father    Cancer  Sister    Social History   Socioeconomic History   Marital status: Widowed    Spouse name: Not on file   Number of children: Not on file   Years of education: Not on file   Highest education level: Not on file  Occupational History   Not on file  Tobacco Use   Smoking status: Former    Packs/day: 1.00    Years: 45.00    Pack years: 45.00    Types: Cigarettes    Quit date: 2005    Years since quitting: 17.6   Smokeless tobacco: Never  Vaping Use   Vaping Use: Never used  Substance and Sexual Activity   Alcohol use: Yes    Comment: 1-2 oz per week    Drug use: Never   Sexual activity: Not on file  Other Topics Concern   Not on file  Social History Narrative   Per [facility] New Patient Packet, abstracted 08/11/2019   Diet:      Caffeine: Coffee, 1 cup a day      Married, if yes what year:  Widowed, 1975      Do you live in a house, apartment, assisted living, condo, trailer, ect: house, 1 1/2 stories, 1 person       Pets: No      Current/Past profession: Completed High school.  Conservator, museum/gallery      Exercise: Walking when able, daily          Living Will: No   DNR: No  POA/HPOA: No      Functional Status:   Do you have difficulty bathing or dressing yourself? No   Do you have difficulty preparing food or eating? No   Do you have difficulty managing your medications? No   Do you have difficulty managing your finances? No   Do you have difficulty affording your medications? No   Social Determinants of Corporate investment banker Strain: Not on file  Food Insecurity: Not on file  Transportation Needs: Not on file  Physical Activity: Not on file  Stress: Not on file  Social Connections: Not on file    Tobacco Counseling Counseling given: Not Answered   Clinical Intake:  Pre-visit preparation completed: Yes  Pain : No/denies pain     BMI - recorded: 27 Nutritional Risks: None Diabetes: No  How often do you need to have someone help  you when you read instructions, pamphlets, or other written materials from your doctor or pharmacy?: 1 - Never  Diabetic?no         Activities of Daily Living In your present state of health, do you have any difficulty performing the following activities: 06/20/2021 07/23/2020  Hearing? Y N  Vision? N N  Difficulty concentrating or making decisions? N N  Walking or climbing stairs? N N  Dressing or bathing? N N  Doing errands, shopping? N N  Preparing Food and eating ? N -  Using the Toilet? N -  In the past six months, have you accidently leaked urine? N -  Do you have problems with loss of bowel control? N -  Managing your Medications? N -  Managing your Finances? N -  Housekeeping or managing your Housekeeping? N -  Some recent data might be hidden    Patient Care Team: [name], NP as PCP - General (Geriatric Medicine)  Indicate any recent Medical Services you may have received from other than [facility] providers in the past year (date may be approximate).     Assessment:   This is a routine wellness examination for [name].  Hearing/Vision screen Hearing Screening - Comments:: Patient has some hearing problems Vision Screening - Comments:: Patient has no vision problems. Patient has not had recent eye exam. [facility]  Dietary issues and exercise activities discussed: Current Exercise Habits: The patient does not participate in regular exercise at present   Goals Addressed   None    Depression Screen PHQ 2/9 Scores 06/20/2021 09/04/2020 06/18/2020 06/03/2020 12/04/2019  PHQ - 2 Score 0 0 0 0 0    Fall Risk Fall Risk  06/20/2021 11/13/2020 09/23/2020 09/04/2020 06/18/2020  Falls in the past year? 0 0 0 0 0  Number falls in past yr: 0 0 0 0 0  Injury with Fall? 0 0 0 0 0  Risk for fall due to : No Fall Risks - - - -  Follow up Falls evaluation completed - - - -    FALL RISK PREVENTION PERTAINING TO THE HOME:  Any stairs in or around the  home? Yes  If so, are there any without handrails? No  Home free of loose throw rugs in walkways, pet beds, electrical cords, etc? Yes  Adequate lighting in your home to reduce risk of falls? Yes   ASSISTIVE DEVICES UTILIZED TO PREVENT FALLS:  Life alert? No  Use of a cane, walker or w/c? No  Grab bars in the bathroom? Yes  Shower chair or bench in shower? No  Elevated toilet seat or a handicapped toilet? No   TIMED UP AND GO:  Was the test performed? No .    Cognitive Function:     6CIT Screen 06/20/2021 06/18/2020  What Year? 0 points 0 points  What month? 0 points 0 points  What time? 0 points 0 points  Count back from 20 0 points 0 points  Months in reverse 0 points 0 points  Repeat phrase 2 points 2 points  Total Score 2 2    Immunizations Immunization History  Administered Date(s) Administered   Fluad Quad(high Dose 65+) 09/04/2020   Influenza Split 08/18/2019   Influenza, High Dose Seasonal PF 09/04/2020   Influenza,inj,Quad PF,6+ Mos 08/18/2019   Moderna Sars-Covid-2 Vaccination 05/20/2020, 06/19/2020   Pneumococcal Conjugate-13 09/04/2020    TDAP status: Due, Education has been provided regarding the importance of this vaccine. Advised may receive this vaccine at local pharmacy or Health Dept. Aware to provide a copy of the vaccination record if obtained from local pharmacy or Health Dept. Verbalized acceptance and understanding.  Flu Vaccine status: Due, Education has been provided regarding the importance of this vaccine. Advised may receive this vaccine at local pharmacy or Health Dept. Aware to provide a copy of the vaccination record if obtained from local pharmacy or Health Dept. Verbalized acceptance and understanding.  Pneumococcal vaccine status: Due, Education has been provided regarding the importance of this vaccine. Advised may receive this vaccine at local pharmacy or Health Dept. Aware to provide a copy of the vaccination record if obtained from  local pharmacy or Health Dept. Verbalized acceptance and understanding.  Covid-19 vaccine status: Information provided on how to obtain vaccines.   Qualifies for Shingles Vaccine? Yes   Zostavax completed No   Shingrix Completed?: No.    Education has been provided regarding the importance of this vaccine. Patient has been advised to call insurance company to determine out of pocket expense if they have not yet received this vaccine. Advised may also receive vaccine at local pharmacy or Health Dept. Verbalized acceptance and understanding.  Screening Tests Health Maintenance  Topic Date Due   TETANUS/TDAP  Never done   Zoster Vaccines- Shingrix (1 of 2) Never done   COVID-19 Vaccine (3 - Booster for Moderna series) 11/19/2020   INFLUENZA VACCINE  06/09/2021   PNA vac Low Risk Adult (2 of 2 - PPSV23) 09/04/2021   Fecal DNA (Cologuard)  07/10/2023   Hepatitis C Screening  Completed   HPV VACCINES  Aged Out    Health Maintenance  Health Maintenance Due  Topic Date Due   TETANUS/TDAP  Never done   Zoster Vaccines- Shingrix (1 of 2) Never done   COVID-19 Vaccine (3 - Booster for Moderna series) 11/19/2020   INFLUENZA VACCINE  06/09/2021    Colorectal cancer screening: Type of screening: Cologuard. Completed 2021. Repeat every 3 years  Lung Cancer Screening: (Low Dose CT Chest recommended if Age 19-80 years, 30 pack-year currently smoking OR have quit w/in 15years.) does not qualify.   Lung Cancer Screening Referral: na  Additional Screening:  Hepatitis C Screening: does qualify; Completed 2020  Vision Screening: Recommended annual ophthalmology exams for early detection of glaucoma and other disorders of the eye. Is the patient up to date with their annual eye exam?  Yes  Who is the provider or what is the [facility] of the [facility] which the [facility] If pt is not established with a provider, would they like to  be referred to a provider to establish  care? No .   Dental Screening: Recommended annual dental exams for proper oral hygiene  Community Resource Referral / Chronic Care Management: CRR required this visit?  No   CCM required this visit?  No      Plan:     I have personally reviewed and noted the following in the patient's chart:   Medical and social history Use of alcohol, tobacco or illicit drugs  Current medications and supplements including opioid prescriptions. Patient is not currently taking opioid prescriptions. Functional ability and status Nutritional status Physical activity Advanced directives List of other physicians Hospitalizations, surgeries, and ER visits in previous 12 months Vitals Screenings to include cognitive, depression, and falls Referrals and appointments  In addition, I have reviewed and discussed with patient certain preventive protocols, quality metrics, and best practice recommendations. A written personalized care plan for preventive services as well as general preventive health recommendations were provided to patient.     Sharon SellerJessica K Ladena Jacquez, NP   06/20/2021    Virtual Visit via Telephone Note  I connected withNAME@ on 06/20/21 at 10:30 AM EDT by telephone and verified that I am speaking with the correct person using two identifiers.  Location: Patient: home Provider: [facility]   I discussed the limitations, risks, security and privacy concerns of performing an evaluation and management service by telephone and the availability of in person appointments. I also discussed with the patient that there may be a patient responsible charge related to this service. The patient expressed understanding and agreed to proceed.   I discussed the assessment and treatment plan with the patient. The patient was provided an opportunity to ask questions and all were answered. The patient agreed with the plan and demonstrated an understanding of the instructions.   The patient was advised to  call back or seek an in-person evaluation if the symptoms worsen or if the condition fails to improve as anticipated.  I provided 18 minutes of non-face-to-face time during this encounter.  Janene HarveyJessica K. Biagio BorgEubanks, AGNP Avs printed and mailed

## 2021-06-20 NOTE — Telephone Encounter (Signed)
[name], Zachary Best are scheduled for a virtual visit with your provider today.    Just as we do with appointments in the office, we must obtain your consent to participate.  Your consent will be active for this visit and any virtual visit you may have with one of our providers in the next 365 days.    If you have a MyChart account, I can also send a copy of this consent to you electronically.  All virtual visits are billed to your insurance company just like a traditional visit in the office.  As this is a virtual visit, video technology does not allow for your provider to perform a traditional examination.  This may limit your provider's ability to fully assess your condition.  If your provider identifies any concerns that need to be evaluated in person or the need to arrange testing such as labs, EKG, etc, we will make arrangements to do so.    Although advances in technology are sophisticated, we cannot ensure that it will always work on either your end or our end.  If the connection with a video visit is poor, we may have to switch to a telephone visit.  With either a video or telephone visit, we are not always able to ensure that we have a secure connection.   I need to obtain your verbal consent now.   Are you willing to proceed with your visit today?   Zachary Best has provided verbal consent on 06/20/2021 for a virtual visit (video or telephone).   Elveria Royals, CMA 06/20/2021  10:45 AM

## 2021-06-20 NOTE — Progress Notes (Signed)
This service is provided via telemedicine  No vital signs collected/recorded due to the encounter was a telemedicine visit.   Location of patient (ex: home, work):  Work  Patient consents to a telephone visit:  Yes, see enocunter dated 06/20/2021  Location of the provider (ex: office, home):  [facility]  Name of any referring provider:  N/A  Names of all persons participating in the telemedicine service and their role in the encounter:  Abbey Chatters, Nurse Practitioner, Elveria Royals, CMA, and patient.   Time spent on call:  8 minutes with medical assistant

## 2021-08-02 IMAGING — DX DG PORTABLE PELVIS
1 series · 1 of 1 positions shown · non-contrast
Comparison: None.

CLINICAL DATA: Post right hip replacement

EXAM:
PORTABLE PELVIS 1-2 VIEWS

[pelvis ap]
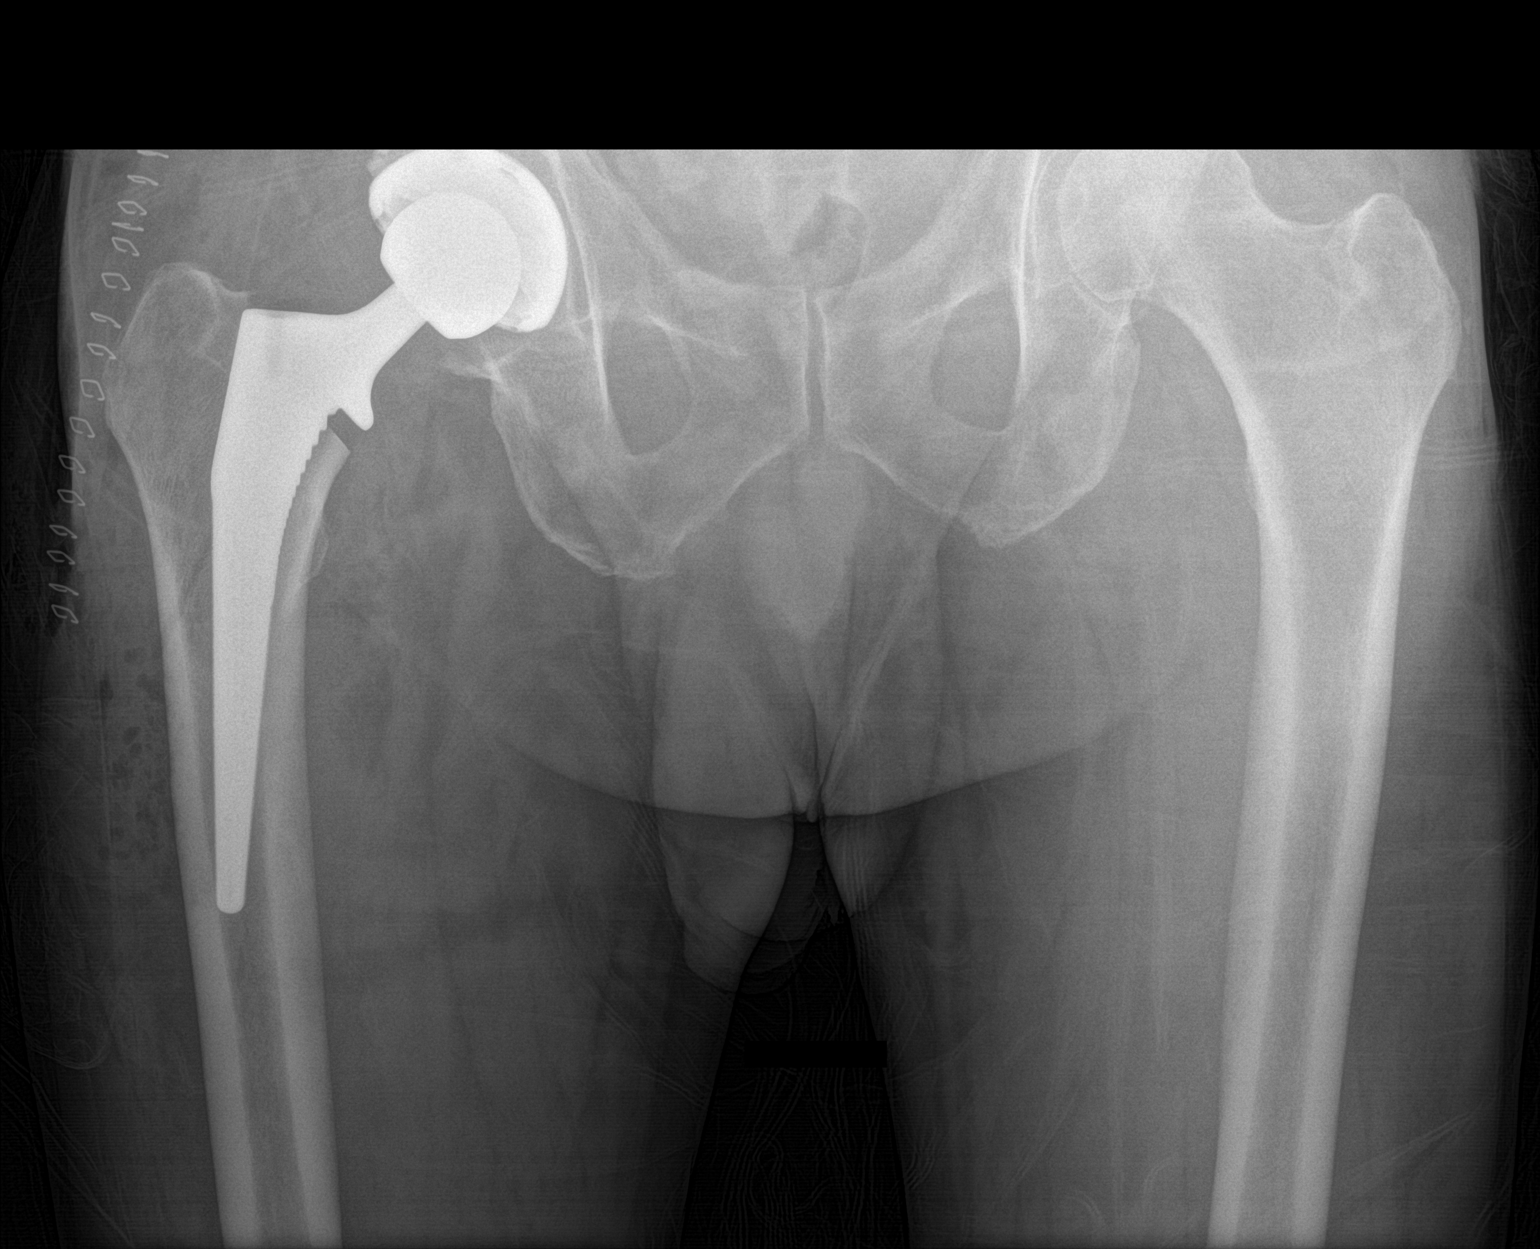

[1 of 1 positions shown; findings below may reference images not displayed]

FINDINGS: Changes of right hip replacement. Normal AP alignment. No hardware
or bony complicating feature.
IMPRESSION: Right hip replacement.  No visible complicating feature.

## 2022-01-06 ENCOUNTER — Other Ambulatory Visit: Payer: Medicare Other

## 2022-01-06 DIAGNOSIS — E785 Hyperlipidemia, unspecified: Secondary | ICD-10-CM

## 2022-01-06 DIAGNOSIS — M1611 Unilateral primary osteoarthritis, right hip: Secondary | ICD-10-CM

## 2022-01-09 ENCOUNTER — Ambulatory Visit: Payer: Medicare Other | Admitting: Nurse Practitioner

## 2022-01-19 ENCOUNTER — Encounter: Payer: Self-pay | Admitting: Nurse Practitioner

## 2022-01-19 ENCOUNTER — Ambulatory Visit (INDEPENDENT_AMBULATORY_CARE_PROVIDER_SITE_OTHER): Payer: Medicare Other | Admitting: Nurse Practitioner

## 2022-01-19 ENCOUNTER — Other Ambulatory Visit: Payer: Self-pay

## 2022-01-19 VITALS — BP 132/78 | HR 74 | Temp 97.4°F | Ht 72.0 in | Wt 197.0 lb

## 2022-01-19 DIAGNOSIS — E663 Overweight: Secondary | ICD-10-CM | POA: Diagnosis not present

## 2022-01-19 DIAGNOSIS — Z23 Encounter for immunization: Secondary | ICD-10-CM | POA: Diagnosis not present

## 2022-01-19 DIAGNOSIS — Z91018 Allergy to other foods: Secondary | ICD-10-CM

## 2022-01-19 DIAGNOSIS — E785 Hyperlipidemia, unspecified: Secondary | ICD-10-CM

## 2022-01-19 DIAGNOSIS — M1611 Unilateral primary osteoarthritis, right hip: Secondary | ICD-10-CM

## 2022-01-19 DIAGNOSIS — N401 Enlarged prostate with lower urinary tract symptoms: Secondary | ICD-10-CM | POA: Diagnosis not present

## 2022-01-19 LAB — COMPLETE METABOLIC PANEL WITH GFR
AG Ratio: 1.6 (calc) (ref 1.0–2.5)
ALT: 13 U/L (ref 9–46)
AST: 11 U/L (ref 10–35)
Albumin: 4.1 g/dL (ref 3.6–5.1)
Alkaline phosphatase (APISO): 92 U/L (ref 35–144)
BUN: 17 mg/dL (ref 7–25)
CO2: 26 mmol/L (ref 20–32)
Calcium: 9.5 mg/dL (ref 8.6–10.3)
Chloride: 105 mmol/L (ref 98–110)
Creat: 1.17 mg/dL (ref 0.70–1.35)
Globulin: 2.6 g/dL (calc) (ref 1.9–3.7)
Glucose, Bld: 92 mg/dL (ref 65–139)
Potassium: 4.7 mmol/L (ref 3.5–5.3)
Sodium: 138 mmol/L (ref 135–146)
Total Bilirubin: 0.4 mg/dL (ref 0.2–1.2)
Total Protein: 6.7 g/dL (ref 6.1–8.1)
eGFR: 69 mL/min/{1.73_m2} (ref >=60)

## 2022-01-19 LAB — LIPID PANEL
Cholesterol: 205 mg/dL — ABNORMAL HIGH (ref <200)
HDL: 45 mg/dL (ref >=40)
LDL Cholesterol (Calc): 121 mg/dL (calc) — ABNORMAL HIGH
Non-HDL Cholesterol (Calc): 160 mg/dL (calc) — ABNORMAL HIGH (ref <130)
Total CHOL/HDL Ratio: 4.6 (calc) (ref <5.0)
Triglycerides: 240 mg/dL — ABNORMAL HIGH (ref <150)

## 2022-01-19 LAB — CBC WITH DIFFERENTIAL/PLATELET
Absolute Monocytes: 623 cells/uL (ref 200–950)
Basophils Absolute: 47 cells/uL (ref 0–200)
Basophils Relative: 0.7 %
Eosinophils Absolute: 168 cells/uL (ref 15–500)
Eosinophils Relative: 2.5 %
HCT: 43.9 % (ref 38.5–50.0)
Hemoglobin: 14.7 g/dL (ref 13.2–17.1)
Lymphs Abs: 1119 cells/uL (ref 850–3900)
MCH: 30 pg (ref 27.0–33.0)
MCHC: 33.5 g/dL (ref 32.0–36.0)
MCV: 89.6 fL (ref 80.0–100.0)
MPV: 12.4 fL (ref 7.5–12.5)
Monocytes Relative: 9.3 %
Neutro Abs: 4744 cells/uL (ref 1500–7800)
Neutrophils Relative %: 70.8 %
Platelets: 197 10*3/uL (ref 140–400)
RBC: 4.9 10*6/uL (ref 4.20–5.80)
RDW: 13.5 % (ref 11.0–15.0)
Total Lymphocyte: 16.7 %
WBC: 6.7 10*3/uL (ref 3.8–10.8)

## 2022-01-19 NOTE — Patient Instructions (Signed)
To get shingles shot, tdap and pneumococcal 23  at local pharmacy ? ? ?

## 2022-01-19 NOTE — Progress Notes (Signed)
? ? ?Careteam: ?Patient Care Team: ?Lauree Chandler, NP as PCP - General (Geriatric Medicine) ? ?PLACE OF SERVICE:  ?[facility]  ?Advanced Directive information ?Does Patient Have a Medical Advance Directive?: No, Would patient like information on creating a medical advance directive?: No - Patient declined ? ?Allergies  ?Allergen Reactions  ? Beef-Derived Products Anaphylaxis  ? Other   ?  Peanut Butter, Anaphylaxis   ? Rapaflo [Silodosin]   ?  Acute congestion and dizziness  ? Succinylcholine Other (See Comments)  ?  Family history of pseudocholinesterase deficiency (father and sister)  ? Penicillins Other (See Comments)  ?  "childhood allergy", he doesn't know reaction   ? ? ?Chief Complaint  ?Patient presents with  ? Medical Management of Chronic Issues  ?  1 year follow-up. Not fasting today. Discuss need for TD/tdap, shingrix, and pneumonia vaccine. Flu vaccine today   ? ? ? ?HPI: Patient is a 66 y.o. male for routine follow up ? ?Hx of bph s/p TURP- followed by urologist, doing well. No changes in frequency or flow.  ? 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  ? ?He is very active outside with work. Working 40 hours a week which is physical.  ? 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 He has given his children verbal instruction.  ? ? ?Review of Systems:  ?Review of Systems  ?Constitutional:  Negative for chills, fever and weight loss.  ?HENT:  Positive for hearing loss. Negative for tinnitus.   ?Respiratory:  Negative for cough, sputum production and shortness of breath.   ?Cardiovascular:  Negative for chest pain, palpitations and leg swelling.  ?Gastrointestinal:  Negative for abdominal pain, constipation, diarrhea and heartburn.  ?Genitourinary:  Negative for dysuria, frequency and urgency.  ?Musculoskeletal:  Negative for back pain, falls, joint pain and myalgias.  ?Skin: Negative.   ?Neurological:  Negative for  dizziness and headaches.  ?Psychiatric/Behavioral:  Negative for depression and memory loss. The patient does not have insomnia.   ? ?Past Medical History:  ?Diagnosis Date  ? Aftercare following right hip joint replacement surgery   ? Allergy   ? seasonal  ? Anaphylaxis   ? Per [facility] Patient Packet   ? Arthritis   ? Family history of adverse reaction to anesthesia   ? ?Past Surgical History:  ?Procedure Laterality Date  ? APPENDECTOMY  1966  ? Per [facility] Patient Packet   ? TOTAL HIP ARTHROPLASTY Right 07/26/2020  ? Procedure: RIGHT TOTAL HIP ARTHROPLASTY ANTERIOR APPROACH;  Surgeon: Mcarthur Rossetti, MD;  Location: [facility];  Service: Orthopedics;  Laterality: Right;  ? TRANSURETHRAL RESECTION OF PROSTATE  12/26/2020  ? ?Social History: ?  reports that he quit smoking about 18 years ago. His smoking use included cigarettes. He has a 45.00 pack-year smoking history. He has never used smokeless tobacco. He reports current alcohol use. He reports that he does not use drugs. ? ?Family History  ?Problem Relation Age of Onset  ? Stroke Mother   ? Diabetes Mother   ?     borderline  ? Alzheimer's disease Father   ? Cancer Sister   ? ? ?Medications: ?Patient's Medications  ? No medications on file  ? ? ?Physical Exam: ? ?Vitals:  ? 01/19/22 1000  ?BP: 132/78  ?Pulse: 74  ?Temp: (!) 97.4 ?F (36.3 ?C)  ?TempSrc: Temporal  ?SpO2: 99%  ?Weight:  197 lb (89.4 kg)  ?Height: 6' (1.829 m)  ? ?Body mass index is 26.72 kg/m?. ?Wt Readings from Last 3 Encounters:  ?01/19/22 197 lb (89.4 kg)  ?01/06/21 200 lb (90.7 kg)  ?11/13/20 192 lb 12.8 oz (87.5 kg)  ? ? ?Physical Exam ?Constitutional:   ?   General: He is not in acute distress. ?   Appearance: He is well-developed. He is not diaphoretic.  ?HENT:  ?   Head: Normocephalic and atraumatic.  ?   Right Ear: External ear normal.  ?   Left Ear: External ear normal.  ?   Mouth/Throat:  ?   Pharynx: No oropharyngeal exudate.  ?Eyes:  ?    Conjunctiva/sclera: Conjunctivae normal.  ?   Pupils: Pupils are equal, round, and reactive to light.  ?Cardiovascular:  ?   Rate and Rhythm: Normal rate and regular rhythm.  ?   Heart sounds: Normal heart sounds.  ?Pulmonary:  ?   Effort: Pulmonary effort is normal.  ?   Breath sounds: Normal breath sounds.  ?Abdominal:  ?   General: Bowel sounds are normal.  ?   Palpations: Abdomen is soft.  ?Musculoskeletal:     ?   General: No tenderness.  ?   Cervical back: Normal range of motion and neck supple.  ?   Right lower leg: No edema.  ?   Left lower leg: No edema.  ?Skin: ?   General: Skin is warm and dry.  ?Neurological:  ?   Mental Status: He is alert and oriented to person, place, and time.  ? ? ?Labs reviewed: ?Basic Metabolic Panel: ?No results for input(s): NA, K, CL, CO2, GLUCOSE, BUN, CREATININE, CALCIUM, MG, PHOS, TSH in the last 8760 hours. ?Liver Function Tests: ?No results for input(s): AST, ALT, ALKPHOS, BILITOT, PROT, ALBUMIN in the last 8760 hours. ?No results for input(s): LIPASE, AMYLASE in the last 8760 hours. ?No results for input(s): AMMONIA in the last 8760 hours. ?CBC: ?No results for input(s): WBC, NEUTROABS, HGB, HCT, MCV, PLT in the last 8760 hours. ?Lipid Panel: ?No results for input(s): CHOL, HDL, LDLCALC, TRIG, CHOLHDL, LDLDIRECT in the last 8760 hours. ?TSH: ?No results for input(s): TSH in the last 8760 hours. ?A1C: ?No results found for: HGBA1C ? ? ?Assessment/Plan ?1. Benign prostatic hyperplasia with lower urinary tract symptoms, symptom details unspecified ?-without symptoms s/p turp.  ?- CBC with Differential/Platelet ?- CMP with eGFR([facility]) ? ?2. Hyperlipidemia LDL goal <100 ?-dietary modifications encouraged but he has not changed eating.  ?- CBC with Differential/Platelet ?- CMP with eGFR([facility]) ?- Lipid panel ? ?3. Overweight with body mass index (BMI) 25.0-29.9 ?--education provided on healthy weight loss through increase in physical activity and proper nutrition  ?- CBC with  Differential/Platelet ? ?4. Allergy to alpha-gal ?Avoiding beef, no recent reaction ? ?5. Unilateral primary osteoarthritis, right hip ?-stable, very active, no pain ?- CBC with Differential/Platelet ? ?6. Need for influenza vaccination ?-given today ? ? ?Return in about 1 year (around 01/20/2023) for yearly . ? ?Carlos American. Dewaine Oats, AGNP ? ?[facility] ?[phone]  ?

## 2022-05-19 ENCOUNTER — Encounter: Payer: Self-pay | Admitting: Family

## 2022-05-19 ENCOUNTER — Ambulatory Visit (INDEPENDENT_AMBULATORY_CARE_PROVIDER_SITE_OTHER): Payer: Medicare Other | Admitting: Family

## 2022-05-19 VITALS — BP 118/70 | HR 80 | Temp 98.0°F | Resp 16 | Ht 72.0 in | Wt 197.4 lb

## 2022-05-19 DIAGNOSIS — L03113 Cellulitis of right upper limb: Secondary | ICD-10-CM | POA: Diagnosis not present

## 2022-05-19 DIAGNOSIS — S40861A Insect bite (nonvenomous) of right upper arm, initial encounter: Secondary | ICD-10-CM

## 2022-05-19 DIAGNOSIS — W57XXXA Bitten or stung by nonvenomous insect and other nonvenomous arthropods, initial encounter: Secondary | ICD-10-CM | POA: Diagnosis not present

## 2022-05-19 MED ORDER — DOXYCYCLINE HYCLATE 100 MG PO TABS: 100.0000 mg | ORAL_TABLET | Freq: Two times a day (BID) | ORAL | 0 refills | Status: AC

## 2022-05-19 NOTE — Progress Notes (Signed)
Provider: Tallin Hart FNP-C  [name], NP  Patient Care Team: [name], NP as PCP - General (Geriatric Medicine)  Extended Emergency Contact Information Primary Emergency Contact: Link Snuffer [place] Mobile Phone: [phone] Relation: Daughter  Code Status: Full Code  Goals of care: Advanced Directive information    05/19/2022   11:00 AM  Advanced Directives  Does Patient Have a Medical Advance Directive? No  Would patient like information on creating a medical advance directive? No - Patient declined     Chief Complaint  Patient presents with   Acute Visit    Patient complains of spot on right arm that is tender to touch, and itching sometimes. Patient states that he noticed this spot 3 weeks ago.     HPI:  Pt is a 66 y.o. male seen today for an acute visit for evaluation of spot on right arm x 3 weeks.states seems to be getting bigger.Not sure if he was bit by spider or tick.He works on septic tanks. Spot has been tender to touch and itchy.Has not seen any erythema around the bit site. Also denies any fever,chills,nausea,vomiting or muscle aches.  Ha applied hydrocortisone for itchy.  Appetite described as good.   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    Allergy    seasonal   Anaphylaxis    Per [facility] New Patient Packet    Arthritis    Family history of adverse reaction to anesthesia    Past Surgical History:  Procedure Laterality Date   APPENDECTOMY  1966   Per [facility] New Patient Packet    TOTAL HIP ARTHROPLASTY Right 07/26/2020   Procedure: RIGHT TOTAL HIP ARTHROPLASTY ANTERIOR APPROACH;  Surgeon: Kathryne Hitch, MD;  Location: [facility];  Service: Orthopedics;  Laterality: Right;   TRANSURETHRAL RESECTION OF PROSTATE  12/26/2020    Allergies  Allergen Reactions   Beef-Derived Products Anaphylaxis   Other     Peanut  Butter, Anaphylaxis    Rapaflo [Silodosin]     Acute congestion and dizziness   Succinylcholine Other (See Comments)    Family history of pseudocholinesterase deficiency (father and sister)   Penicillins Other (See Comments)    "childhood allergy", he doesn't know reaction     Outpatient Encounter Medications as of 05/19/2022  Medication Sig   Aspirin-Acetaminophen-Caffeine (GOODY HEADACHE PO) Take 0.5 packets by mouth as needed (As needed.).   No facility-administered encounter medications on file as of 05/19/2022.    Review of Systems  Constitutional:  Negative for appetite change, chills, fatigue, fever and unexpected weight change.  HENT:  Negative for congestion, hearing loss, postnasal drip, rhinorrhea, sinus pressure, sinus pain, sneezing, sore throat and tinnitus.        Has chronic congestion during the summer   Eyes:  Negative for pain, discharge, redness, itching and visual disturbance.  Respiratory:  Negative for cough, chest tightness, shortness of breath and wheezing.   Cardiovascular:  Negative for chest pain, palpitations and leg swelling.  Gastrointestinal:  Negative for abdominal distention, abdominal pain, constipation, diarrhea, nausea and vomiting.  Genitourinary:  Negative for difficulty urinating, dysuria, flank pain, frequency and urgency.  Musculoskeletal:  Negative for arthralgias, back pain, gait problem, joint swelling, myalgias, neck pain and neck stiffness.  Skin:  Negative for color change, pallor and rash.       Right arm insect bite site   Neurological:  Negative for dizziness,  weakness, light-headedness and headaches.  Hematological:  Does not bruise/bleed easily.  Psychiatric/Behavioral:  Negative for agitation, behavioral problems, confusion, hallucinations and sleep disturbance. The patient is not nervous/anxious.     Immunization History  Administered Date(s) Administered   Fluad Quad(high Dose 65+) 09/04/2020, 01/19/2022   Influenza Split  08/18/2019   Influenza, High Dose Seasonal PF 09/04/2020   Influenza,inj,Quad PF,6+ Mos 08/18/2019   Influenza-Unspecified 08/18/2019   Moderna Covid-19 Vaccine Bivalent Booster 61yrs & up 12/29/2021   Moderna Sars-Covid-2 Vaccination 05/20/2020, 06/19/2020   Pneumococcal Conjugate-13 09/04/2020   Pertinent  Health Maintenance Due  Topic Date Due   INFLUENZA VACCINE  06/09/2022      09/23/2020    9:11 AM 11/13/2020    9:04 AM 06/20/2021   10:40 AM 01/19/2022    9:59 AM 05/19/2022   11:00 AM  Fall Risk  Falls in the past year? 0 0 0 0 0  Was there an injury with Fall? 0 0 0 0 0  Fall Risk Category Calculator 0 0 0 0 0  Fall Risk Category Low Low Low Low Low  Patient Fall Risk Level Low fall risk Low fall risk Low fall risk Low fall risk Low fall risk  Patient at Risk for Falls Due to   No Fall Risks No Fall Risks No Fall Risks  Fall risk Follow up   Falls evaluation completed Falls evaluation completed Falls evaluation completed   Functional Status Survey:    Vitals:   05/19/22 1054  BP: 118/70  Pulse: 80  Resp: 16  Temp: 98 F (36.7 C)  SpO2: 97%  Weight: 197 lb 6.4 oz (89.5 kg)  Height: 6' (1.829 m)   Body mass index is 26.77 kg/m. Physical Exam Vitals reviewed.  Constitutional:      General: He is not in acute distress.    Appearance: Normal appearance. He is overweight. He is not ill-appearing or diaphoretic.  HENT:     Head: Normocephalic.     Right Ear: Tympanic membrane, ear canal and external ear normal. There is no impacted cerumen.     Left Ear: Tympanic membrane, ear canal and external ear normal. There is no impacted cerumen.     Nose: Nose normal. No congestion or rhinorrhea.     Mouth/Throat:     Mouth: Mucous membranes are moist.     Pharynx: Oropharynx is clear. No oropharyngeal exudate or posterior oropharyngeal erythema.  Eyes:     General: No scleral icterus.       Right eye: No discharge.        Left eye: No discharge.     Conjunctiva/sclera:  Conjunctivae normal.     Pupils: Pupils are equal, round, and reactive to light.  Neck:     Vascular: No carotid bruit.  Cardiovascular:     Rate and Rhythm: Normal rate and regular rhythm.     Pulses: Normal pulses.     Heart sounds: Normal heart sounds. No murmur heard.    No friction rub. No gallop.  Pulmonary:     Effort: Pulmonary effort is normal. No respiratory distress.     Breath sounds: Normal breath sounds. No wheezing, rhonchi or rales.  Chest:     Chest wall: No tenderness.  Abdominal:     General: Bowel sounds are normal. There is no distension.     Palpations: Abdomen is soft. There is no mass.     Tenderness: There is no abdominal tenderness. There is no right CVA tenderness, left  CVA tenderness, guarding or rebound.  Musculoskeletal:        General: No swelling or tenderness. Normal range of motion.     Cervical back: Normal range of motion. No rigidity or tenderness.     Right lower leg: No edema.     Left lower leg: No edema.  Lymphadenopathy:     Cervical: No cervical adenopathy.  Skin:    General: Skin is warm and dry.     Coloration: Skin is not pale.     Findings: No bruising, erythema or rash.     Comments: Raised area on right arm measuring 0.8 x 0.8 cm slight tender to touch without any erythema.   Neurological:     Mental Status: He is alert and oriented to person, place, and time.     Motor: No weakness.     Gait: Gait normal.  Psychiatric:        Mood and Affect: Mood normal.        Speech: Speech normal.        Behavior: Behavior normal.     Labs reviewed: Recent Labs    01/19/22 1026  NA 138  K 4.7  CL 105  CO2 26  GLUCOSE 92  BUN 17  CREATININE 1.17  CALCIUM 9.5   Recent Labs    01/19/22 1026  AST 11  ALT 13  BILITOT 0.4  PROT 6.7   Recent Labs    01/19/22 1026  WBC 6.7  NEUTROABS 4,744  HGB 14.7  HCT 43.9  MCV 89.6  PLT 197   No results found for: "TSH" No results found for: "HGBA1C" Lab Results  Component  Value Date   CHOL 205 (H) 01/19/2022   HDL 45 01/19/2022   LDLCALC 121 (H) 01/19/2022   TRIG 240 (H) 01/19/2022   CHOLHDL 4.6 01/19/2022    Significant Diagnostic Results in last 30 days:  No results found.  Assessment/Plan 1. Cellulitis of right elbow Afebrile. Raised spot on arm slight tende rto touch.No drainage.suspect possible spider bite unlikely Tick bite no circular erythema noted. - Doxycycline as below.Discussed use, risks and benefits of the medication verbalized understanding.states eats [place] every day.will wear long sleeves when out on the sun to prevent skin burn.  - doxycycline (VIBRA-TABS) 100 MG tablet; Take 1 tablet (100 mg total) by mouth 2 (two) times daily for 7 days.  Dispense: 14 tablet; Refill: 0  2. Insect bite of right upper extremity, initial encounter Will start on doxycycline as above.   Family/ staff Communication: Reviewed plan of care with patient verbalized understanding   Labs/tests ordered: None   Next Appointment: Return if symptoms worsen or fail to improve.   Caesar Bookman, NP

## 2022-05-19 NOTE — Patient Instructions (Signed)
-   Notify provider if symptoms worsen or fail to improve  °

## 2022-06-23 ENCOUNTER — Telehealth: Payer: Self-pay

## 2022-06-23 ENCOUNTER — Ambulatory Visit (INDEPENDENT_AMBULATORY_CARE_PROVIDER_SITE_OTHER): Payer: Medicare Other | Admitting: Nurse Practitioner

## 2022-06-23 ENCOUNTER — Encounter: Payer: Self-pay | Admitting: Nurse Practitioner

## 2022-06-23 DIAGNOSIS — Z0000 Encounter for general adult medical examination without abnormal findings: Secondary | ICD-10-CM

## 2022-06-23 NOTE — Patient Instructions (Signed)
Zachary Best , Thank you for taking time to come for your Medicare Wellness Visit. I appreciate your ongoing commitment to your health goals. Please review the following plan we discussed and let me know if I can assist you in the future.   Screening recommendations/referrals: Colonoscopy cologuard in 2021- every  3 years  Recommended yearly ophthalmology/optometry visit for glaucoma screening and checkup Recommended yearly dental visit for hygiene and checkup  Vaccinations: Influenza vaccine due annually in September/October Pneumococcal vaccine - due can get at office or local pharmacy- Pneumococcal 23  Tdap vaccine DUE- recommend to get at your local pharmacy Shingles vaccine DUE- recommend to get at your local pharmacy    Advanced directives: recommended to complete and place on file.   Conditions/risks identified: advanced age  Next appointment: yearly   Preventive Care 69 Years and Older, Male Preventive care refers to lifestyle choices and visits with your health care provider that can promote health and wellness. What does preventive care include? A yearly physical exam. This is also called an annual well check. Dental exams once or twice a year. Routine eye exams. Ask your health care provider how often you should have your eyes checked. Personal lifestyle choices, including: Daily care of your teeth and gums. Regular physical activity. Eating a healthy diet. Avoiding tobacco and drug use. Limiting alcohol use. Practicing safe sex. Taking low doses of aspirin every day. Taking vitamin and mineral supplements as recommended by your health care provider. What happens during an annual well check? The services and screenings done by your health care provider during your annual well check will depend on your age, overall health, lifestyle risk factors, and family history of disease. Counseling  Your health care provider may ask you questions about your: Alcohol use. Tobacco  use. Drug use. Emotional well-being. Home and relationship well-being. Sexual activity. Eating habits. History of falls. Memory and ability to understand (cognition). Work and work Astronomer. Screening  You may have the following tests or measurements: Height, weight, and BMI. Blood pressure. Lipid and cholesterol levels. These may be checked every 5 years, or more frequently if you are over 83 years old. Skin check. Lung cancer screening. You may have this screening every year starting at age 95 if you have a 30-pack-year history of smoking and currently smoke or have quit within the past 15 years. Fecal occult blood test (FOBT) of the stool. You may have this test every year starting at age 67. Flexible sigmoidoscopy or colonoscopy. You may have a sigmoidoscopy every 5 years or a colonoscopy every 10 years starting at age 83. Prostate cancer screening. Recommendations will vary depending on your family history and other risks. Hepatitis C blood test. Hepatitis B blood test. Sexually transmitted disease (STD) testing. Diabetes screening. This is done by checking your blood sugar (glucose) after you have not eaten for a while (fasting). You may have this done every 1-3 years. Abdominal aortic aneurysm (AAA) screening. You may need this if you are a current or former smoker. Osteoporosis. You may be screened starting at age 51 if you are at high risk. Talk with your health care provider about your test results, treatment options, and if necessary, the need for more tests. Vaccines  Your health care provider may recommend certain vaccines, such as: Influenza vaccine. This is recommended every year. Tetanus, diphtheria, and acellular pertussis (Tdap, Td) vaccine. You may need a Td booster every 10 years. Zoster vaccine. You may need this after age 7. Pneumococcal 13-valent conjugate (  PCV13) vaccine. One dose is recommended after age 5. Pneumococcal polysaccharide (PPSV23) vaccine.  One dose is recommended after age 8. Talk to your health care provider about which screenings and vaccines you need and how often you need them. This information is not intended to replace advice given to you by your health care provider. Make sure you discuss any questions you have with your health care provider. Document Released: 11/22/2015 Document Revised: 07/15/2016 Document Reviewed: 08/27/2015 Elsevier Interactive Patient Education  2017 [employer] Prevention in the Home Falls can cause injuries. They can happen to people of all ages. There are many things you can do to make your home safe and to help prevent falls. What can I do on the outside of my home? Regularly fix the edges of walkways and driveways and fix any cracks. Remove anything that might make you trip as you walk through a door, such as a raised step or threshold. Trim any bushes or trees on the path to your home. Use bright outdoor lighting. Clear any walking paths of anything that might make someone trip, such as rocks or tools. Regularly check to see if handrails are loose or broken. Make sure that both sides of any steps have handrails. Any raised decks and porches should have guardrails on the edges. Have any leaves, snow, or ice cleared regularly. Use sand or salt on walking paths during winter. Clean up any spills in your garage right away. This includes oil or grease spills. What can I do in the bathroom? Use night lights. Install grab bars by the toilet and in the tub and shower. Do not use towel bars as grab bars. Use non-skid mats or decals in the tub or shower. If you need to sit down in the shower, use a plastic, non-slip stool. Keep the floor dry. Clean up any water that spills on the floor as soon as it happens. Remove soap buildup in the tub or shower regularly. Attach bath mats securely with double-sided non-slip rug tape. Do not have throw rugs and other things on the floor that can make  you trip. What can I do in the bedroom? Use night lights. Make sure that you have a light by your bed that is easy to reach. Do not use any sheets or blankets that are too big for your bed. They should not hang down onto the floor. Have a firm chair that has side arms. You can use this for support while you get dressed. Do not have throw rugs and other things on the floor that can make you trip. What can I do in the kitchen? Clean up any spills right away. Avoid walking on wet floors. Keep items that you use a lot in easy-to-reach places. If you need to reach something above you, use a strong step stool that has a grab bar. Keep electrical cords out of the way. Do not use floor polish or wax that makes floors slippery. If you must use wax, use non-skid floor wax. Do not have throw rugs and other things on the floor that can make you trip. What can I do with my stairs? Do not leave any items on the stairs. Make sure that there are handrails on both sides of the stairs and use them. Fix handrails that are broken or loose. Make sure that handrails are as long as the stairways. Check any carpeting to make sure that it is firmly attached to the stairs. Fix any carpet that is  loose or worn. Avoid having throw rugs at the top or bottom of the stairs. If you do have throw rugs, attach them to the floor with carpet tape. Make sure that you have a light switch at the top of the stairs and the bottom of the stairs. If you do not have them, ask someone to add them for you. What else can I do to help prevent falls? Wear shoes that: Do not have high heels. Have rubber bottoms. Are comfortable and fit you well. Are closed at the toe. Do not wear sandals. If you use a stepladder: Make sure that it is fully opened. Do not climb a closed stepladder. Make sure that both sides of the stepladder are locked into place. Ask someone to hold it for you, if possible. Clearly mark and make sure that you can  see: Any grab bars or handrails. First and last steps. Where the edge of each step is. Use tools that help you move around (mobility aids) if they are needed. These include: Canes. Walkers. Scooters. Crutches. Turn on the lights when you go into a dark area. Replace any light bulbs as soon as they burn out. Set up your furniture so you have a clear path. Avoid moving your furniture around. If any of your floors are uneven, fix them. If there are any pets around you, be aware of where they are. Review your medicines with your doctor. Some medicines can make you feel dizzy. This can increase your chance of falling. Ask your doctor what other things that you can do to help prevent falls. This information is not intended to replace advice given to you by your health care provider. Make sure you discuss any questions you have with your health care provider. Document Released: 08/22/2009 Document Revised: 04/02/2016 Document Reviewed: 11/30/2014 Elsevier Interactive Patient Education  2017 [employer].

## 2022-06-23 NOTE — Progress Notes (Signed)
   This service is provided via telemedicine  No vital signs collected/recorded due to the encounter was a telemedicine visit.   Location of patient (ex: home, work):  Home  Patient consents to a telephone visit: Yes, see telephone visit dated 06/23/22  Location of the provider (ex: office, home):  [employer], Remote Location   Name of any referring provider:  N/A  Names of all persons participating in the telemedicine service and their role in the encounter:  S.Chrae B/CMA, Abbey Chatters, NP, and Patient   Time spent on call:  9 min with medical assistant

## 2022-06-23 NOTE — Telephone Encounter (Signed)
Zachary Best, Zachary Best are scheduled for a virtual visit with your provider today.    Just as we do with appointments in the office, we must obtain your consent to participate.  Your consent will be active for this visit and any virtual visit you may have with one of our providers in the next 365 days.    If you have a MyChart account, I can also send a copy of this consent to you electronically.  All virtual visits are billed to your insurance company just like a traditional visit in the office.  As this is a virtual visit, video technology does not allow for your provider to perform a traditional examination.  This may limit your provider's ability to fully assess your condition.  If your provider identifies any concerns that need to be evaluated in person or the need to arrange testing such as labs, EKG, etc, we will make arrangements to do so.    Although advances in technology are sophisticated, we cannot ensure that it will always work on either your end or our end.  If the connection with a video visit is poor, we may have to switch to a telephone visit.  With either a video or telephone visit, we are not always able to ensure that we have a secure connection.   I need to obtain your verbal consent now.   Are you willing to proceed with your visit today?   Zachary Best has provided verbal consent on 06/23/2022 for a virtual visit (video or telephone).   Edison Simon [place], [place] 06/23/2022  11:00 am

## 2022-06-23 NOTE — Progress Notes (Signed)
Subjective:   Zachary Best is a 66 y.o. male who presents for Medicare Annual/Subsequent preventive examination.  Review of Systems           Objective:    There were no vitals filed for this visit. There is no height or weight on file to calculate BMI.     06/23/2022   11:06 AM 05/19/2022   11:00 AM 01/19/2022    9:12 AM 06/20/2021   10:40 AM 01/06/2021    9:48 AM 11/13/2020    9:04 AM 09/23/2020    9:12 AM  Advanced Directives  Does Patient Have a Medical Advance Directive? No No No No No No Yes  Does patient want to make changes to medical advance directive?     Yes (MAU/Ambulatory/Procedural Areas - Information given) No - Patient declined Yes (MAU/Ambulatory/Procedural Areas - Information given)  Would patient like information on creating a medical advance directive? Yes (MAU/Ambulatory/Procedural Areas - Information given) No - Patient declined No - Patient declined        Current Medications (verified) Outpatient Encounter Medications as of 06/23/2022  Medication Sig   Aspirin-Acetaminophen-Caffeine (GOODY HEADACHE PO) Take 0.5 packets by mouth as needed (As needed.).   No facility-administered encounter medications on file as of 06/23/2022.    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    Allergy    seasonal   Anaphylaxis    Per [facility] New Patient Packet    Arthritis    Family history of adverse reaction to anesthesia    Past Surgical History:  Procedure Laterality Date   APPENDECTOMY  1966   Per [facility] New Patient Packet    TOTAL HIP ARTHROPLASTY Right 07/26/2020   Procedure: RIGHT TOTAL HIP ARTHROPLASTY ANTERIOR APPROACH;  Surgeon: Kathryne Hitch, MD;  Location: [facility];  Service: Orthopedics;  Laterality: Right;   TRANSURETHRAL RESECTION OF PROSTATE   12/26/2020   Family History  Problem Relation Age of Onset   Stroke Mother    Diabetes Mother        borderline   Alzheimer's disease Father    Cancer Sister    Social History   Socioeconomic History   Marital status: Widowed    Spouse name: Not on file   Number of children: Not on file   Years of education: Not on file   Highest education level: Not on file  Occupational History   Not on file  Tobacco Use   Smoking status: Former    Packs/day: 1.00    Years: 45.00    Total pack years: 45.00    Types: Cigarettes    Quit date: 2005    Years since quitting: 18.6   Smokeless tobacco: Never  Vaping Use   Vaping Use: Never used  Substance and Sexual Activity   Alcohol use: Yes    Comment: 1-2 oz per week    Drug use: Never   Sexual activity: Not on file  Other Topics Concern   Not on file  Social History Narrative   Per [facility] New Patient Packet, abstracted 08/11/2019   Diet:      Caffeine: Coffee, 1 cup a day      Married, if yes what year:  Widowed, 1975      Do you live in a house, apartment, assisted living, condo, trailer, ect: house, 1 1/2 stories, 1 person  Pets: No      Current/Past profession: Completed High school.  Conservator, museum/gallery      Exercise: Walking when able, daily          Living Will: No   DNR: No   POA/HPOA: No      Functional Status:   Do you have difficulty bathing or dressing yourself? No   Do you have difficulty preparing food or eating? No   Do you have difficulty managing your medications? No   Do you have difficulty managing your finances? No   Do you have difficulty affording your medications? No   Social Determinants of Corporate investment banker Strain: Not on file  Food Insecurity: Not on file  Transportation Needs: Not on file  Physical Activity: Not on file  Stress: Not on file  Social Connections: Not on file    Tobacco Counseling Counseling given: Not Answered   Clinical Intake:                  Diabetic?no         Activities of Daily Living     No data to display          Patient Care Team: [name], NP as PCP - General (Geriatric Medicine)  Indicate any recent Medical Services you may have received from other than [facility] providers in the past year (date may be approximate).     Assessment:   This is a routine wellness examination for Zachary Best.  Hearing/Vision screen Hearing Screening - Comments:: A little hard of hearing, has not followed through with hearing aids  Vision Screening - Comments:: Last eye exam less than 12 months ago with [facility]   Dietary issues and exercise activities discussed:     Goals Addressed   None    Depression Screen    06/23/2022    9:27 AM 01/19/2022    9:59 AM 06/20/2021   10:38 AM 09/04/2020    2:58 PM 06/18/2020    9:06 AM 06/03/2020    8:35 AM 12/04/2019    8:36 AM  PHQ 2/9 Scores  PHQ - 2 Score 0 0 0 0 0 0 0    Fall Risk    06/23/2022    9:27 AM 05/19/2022   11:00 AM 01/19/2022    9:59 AM 06/20/2021   10:40 AM 11/13/2020    9:04 AM  Fall Risk   Falls in the past year? 0 0 0 0 0  Number falls in past yr: 0 0 0 0 0  Injury with Fall? 0 0 0 0 0  Risk for fall due to : No Fall Risks No Fall Risks No Fall Risks No Fall Risks   Follow up Falls evaluation completed Falls evaluation completed Falls evaluation completed Falls evaluation completed     FALL RISK PREVENTION PERTAINING TO THE HOME:  Any stairs in or around the home? Yes  If so, are there any without handrails? No  Home free of loose throw rugs in walkways, pet beds, electrical cords, etc? Yes  Adequate lighting in your home to reduce risk of falls? Yes   ASSISTIVE DEVICES UTILIZED TO PREVENT FALLS:  Life alert? No  Use of a cane, walker or w/c? No  Grab bars in the bathroom? Yes  Shower chair or bench in shower? Yes  Elevated toilet seat or a handicapped toilet? No   TIMED UP AND GO:  Was the test performed? No .  Cognitive Function:        06/23/2022    9:30 AM 06/20/2021   10:40 AM 06/18/2020    9:09 AM  6CIT Screen  What Year? 0 points 0 points 0 points  What month? 0 points 0 points 0 points  What time? 0 points 0 points 0 points  Count back from 20 0 points 0 points 0 points  Months in reverse 0 points 0 points 0 points  Repeat phrase 0 points 2 points 2 points  Total Score 0 points 2 points 2 points    Immunizations Immunization History  Administered Date(s) Administered   Fluad Quad(high Dose 65+) 09/04/2020, 01/19/2022   Influenza Split 08/18/2019   Influenza, High Dose Seasonal PF 09/04/2020   Influenza,inj,Quad PF,6+ Mos 08/18/2019   Influenza-Unspecified 08/18/2019   Moderna Covid-19 Vaccine Bivalent Booster 23yrs & up 12/29/2021   Moderna Sars-Covid-2 Vaccination 05/20/2020, 06/19/2020   Pneumococcal Conjugate-13 09/04/2020    TDAP status: Due, Education has been provided regarding the importance of this vaccine. Advised may receive this vaccine at local pharmacy or Health Dept. Aware to provide a copy of the vaccination record if obtained from local pharmacy or Health Dept. Verbalized acceptance and understanding.  Flu Vaccine status: Due, Education has been provided regarding the importance of this vaccine. Advised may receive this vaccine at local pharmacy or Health Dept. Aware to provide a copy of the vaccination record if obtained from local pharmacy or Health Dept. Verbalized acceptance and understanding.  Pneumococcal vaccine status: Due, Education has been provided regarding the importance of this vaccine. Advised may receive this vaccine at local pharmacy or Health Dept. Aware to provide a copy of the vaccination record if obtained from local pharmacy or Health Dept. Verbalized acceptance and understanding.  Covid-19 vaccine status: Information provided on how to obtain vaccines.   Qualifies for Shingles Vaccine? Yes   Zostavax completed No   Shingrix  Completed?: No.    Education has been provided regarding the importance of this vaccine. Patient has been advised to call insurance company to determine out of pocket expense if they have not yet received this vaccine. Advised may also receive vaccine at local pharmacy or Health Dept. Verbalized acceptance and understanding.  Screening Tests Health Maintenance  Topic Date Due   TETANUS/TDAP  Never done   Zoster Vaccines- Shingrix (1 of 2) Never done   Pneumonia Vaccine 59+ Years old (2 - PPSV23 or PCV20) 09/04/2021   COVID-19 Vaccine (4 - Moderna series) 04/28/2022   INFLUENZA VACCINE  06/09/2022   Fecal DNA (Cologuard)  07/10/2023   Hepatitis C Screening  Completed   HPV VACCINES  Aged Out    Health Maintenance  Health Maintenance Due  Topic Date Due   TETANUS/TDAP  Never done   Zoster Vaccines- Shingrix (1 of 2) Never done   Pneumonia Vaccine 12+ Years old (2 - PPSV23 or PCV20) 09/04/2021   COVID-19 Vaccine (4 - Moderna series) 04/28/2022   INFLUENZA VACCINE  06/09/2022    Colorectal cancer screening: Type of screening: Cologuard. Completed 2021. Repeat every 3 years  Lung Cancer Screening: (Low Dose CT Chest recommended if Age 37-80 years, 30 pack-year currently smoking OR have quit w/in 15years.) does qualify.   Lung Cancer Screening Referral: declined  Additional Screening:  Hepatitis C Screening: does qualify; Completed 2020  Vision Screening: Recommended annual ophthalmology exams for early detection of glaucoma and other disorders of the eye. Is the patient up to date with their annual eye exam?  Yes  Who is the provider or what is the name of the office in which the patient attends annual eye exams? [facility] If pt is not established with a provider, would they like to be referred to a provider to establish care? No .   Dental Screening: Recommended annual dental exams for proper oral hygiene  Community Resource Referral / Chronic Care Management: CRR required this  visit?  No   CCM required this visit?  No      Plan:     I have personally reviewed and noted the following in the patient's chart:   Medical and social history Use of alcohol, tobacco or illicit drugs  Current medications and supplements including opioid prescriptions. Patient is not currently taking opioid prescriptions. Functional ability and status Nutritional status Physical activity Advanced directives List of other physicians Hospitalizations, surgeries, and ER visits in previous 12 months Vitals Screenings to include cognitive, depression, and falls Referrals and appointments  In addition, I have reviewed and discussed with patient certain preventive protocols, quality metrics, and best practice recommendations. A written personalized care plan for preventive services as well as general preventive health recommendations were provided to patient.     [name], NP   06/23/2022   Virtual Visit via Telephone Note  I connected with patient 06/23/22 at 11:00 AM EDT by telephone and verified that I am speaking with the correct person using two identifiers.  Location: Patient: home Provider: [facility]    I discussed the limitations, risks, security and privacy concerns of performing an evaluation and management service by telephone and the availability of in person appointments. I also discussed with the patient that there may be a patient responsible charge related to this service. The patient expressed understanding and agreed to proceed.   I discussed the assessment and treatment plan with the patient. The patient was provided an opportunity to ask questions and all were answered. The patient agreed with the plan and demonstrated an understanding of the instructions.   The patient was advised to call back or seek an in-person evaluation if the symptoms worsen or if the condition fails to improve as anticipated.  I provided 15 minutes of non-face-to-face time  during this encounter.  Janene Harvey. Biag[employer] printed and mailed

## 2022-07-02 ENCOUNTER — Ambulatory Visit
Admission: EM | Admit: 2022-07-02 | Discharge: 2022-07-02 | Disposition: A | Discharge status: Home / Self Care | Payer: Medicare Other | Attending: Family Medicine | Admitting: Family Medicine

## 2022-07-02 DIAGNOSIS — S61419A Laceration without foreign body of unspecified hand, initial encounter: Secondary | ICD-10-CM | POA: Diagnosis not present

## 2022-07-02 MED ORDER — TETANUS-DIPHTH-ACELL PERTUSSIS 5-2.5-18.5 LF-MCG/0.5 IM SUSP: 0.5000 mL | Freq: Once | INTRAMUSCULAR | 0 refills | Status: AC

## 2022-07-02 NOTE — Discharge Instructions (Addendum)
Clean your wound 1-2 times daily with peroxide or warm soapy water, and then put new Neosporin on it.  Keep it clean and dry.  Do not soak it in water for 24 hours from today  Return for suture removal in 5 to 7 days, return sooner if it starts looking red or has drainage  Get your Tdap shot at [facility]

## 2022-07-02 NOTE — ED Triage Notes (Signed)
Pt presents to uc with laceration to L pointer finger medial knuckle. Pt reports he is not on blood thinners but occ takes a goodies powder. Last goodies powder was this morning. Pt reports he was cutting wire when he slipped and cut his hand. Pt st this happened roughly 2 hours ago. Wound undressed and assessed. Applied pressure dressing and bleeding controled at this time

## 2022-07-02 NOTE — ED Provider Notes (Signed)
[facility]    CSN: [id number] Arrival date & time: 07/02/22  1137      History   Chief Complaint Chief Complaint  Patient presents with   Laceration    HPI Zachary Best is a 66 y.o. male.    Laceration  Here for a cut to his left hand. He was cutting wire and he slipped and cut the dorsum of his left hand is.  Uncertain last tetanus. 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   Unilateral primary osteoarthritis, right hip 10/17/2019   Hearing loss of left ear 08/18/2019   Allergy to alpha-gal 08/18/2019    Past Surgical History:  Procedure Laterality Date   APPENDECTOMY  1966   Per [facility] New Patient Packet    TOTAL HIP ARTHROPLASTY Right 07/26/2020   Procedure: RIGHT TOTAL HIP ARTHROPLASTY ANTERIOR APPROACH;  Surgeon: Kathryne Hitch, MD;  Location: [facility];  Service: Orthopedics;  Laterality: Right;   TRANSURETHRAL RESECTION OF PROSTATE  12/26/2020       Home Medications    Prior to Admission medications   Medication Sig Start Date End Date Taking? Authorizing Provider  Tdap (BOOSTRIX) 5-2.5-18.5 LF-MCG/0.5 injection Inject 0.5 mLs into the muscle once for 1 dose. 07/02/22 07/02/22 Yes Darlis Wragg, Janace Aris, MD  Aspirin-Acetaminophen-Caffeine (GOODY HEADACHE PO) Take 0.5 packets by mouth as needed (As needed.).    [provider]    Family History Family History  Problem Relation Age of Onset   Stroke Mother    Diabetes Mother        borderline   Alzheimer's disease Father    Cancer Sister     Social History Social History   Tobacco Use   Smoking status: Former    Packs/day: 1.00    Years:  45.00    Total pack years: 45.00    Types: Cigarettes    Quit date: 2005    Years since quitting: 18.6   Smokeless tobacco: Never  Vaping Use   Vaping Use: Never used  Substance Use Topics   Alcohol use: Yes    Comment: 1-2 oz per week    Drug use: Never     Allergies   Beef-derived products, Other, Rapaflo [silodosin], Succinylcholine, and Penicillins   Review of Systems Review of Systems   Physical Exam Triage Vital Signs ED Triage Vitals [07/02/22 1236]  Enc Vitals Group     BP 131/86     Pulse Rate 76     Resp 18     Temp 97.7 F (36.5 C)     Temp Source Oral     SpO2 96 %     Weight      Height      Head Circumference      Peak Flow      Pain Score      Pain Loc      Pain Edu?      Excl. in GC?    No data found.  Updated Vital Signs BP 131/86   Pulse 76   Temp 97.7 F (36.5 C) (Oral)   Resp 18  SpO2 96%   Visual Acuity Right Eye Distance:   Left Eye Distance:   Bilateral Distance:    Right Eye Near:   Left Eye Near:    Bilateral Near:     Physical Exam Vitals reviewed.  Constitutional:      General: He is not in acute distress.    Appearance: He is not ill-appearing, toxic-appearing or diaphoretic.  Skin:    Capillary Refill: Capillary refill takes less than 2 seconds.     Coloration: Skin is not jaundiced or pale.     Comments: On the dorsum of his left hand there is a 2 cm linear laceration; it is along the second metacarpal of the left hand.  Neurological:     General: No focal deficit present.     Mental Status: He is oriented to person, place, and time.  Psychiatric:        Behavior: Behavior normal.      UC Treatments / Results  Labs (all labs ordered are listed, but only abnormal results are displayed) Labs Reviewed - No data to display  EKG   Radiology No results found.  Procedures Procedures (including critical care time)  Medications Ordered in UC Medications - No data to display  Initial Impression /  Assessment and Plan / UC Course  I have reviewed the triage vital signs and the nursing notes.  Pertinent labs & imaging results that were available during my care of the patient were reviewed by me and considered in my medical decision making (see chart for details).     After verbal consent was obtained, 1% lidocaine with epinephrine was used for infiltration anesthesia.  Good anesthesia was obtained  Under clean conditions 5-0 Prolene was used to approximate the skin edges with 4 sutures.  Good hemostasis was obtained.  EBL is 0 and complications none.  Wound care is explained, and he will return in 5 to 7 days for stitches to be taken out  We discussed the tetanus booster.  His primary care has wanted him to get a Tdap.  He has Medicare part B, which will not cover that.  We do not have Td here in the clinic (I discussed with the patient that Td is covered by Medicare part B with an injury.)  He is going to proceed to the [place] pharmacy and get his Tdap today. Final Clinical Impressions(s) / UC Diagnoses   Final diagnoses:  Laceration of dorsum of hand     Discharge Instructions      Clean your wound 1-2 times daily with peroxide or warm soapy water, and then put new Neosporin on it.  Keep it clean and dry.  Do not soak it in water for 24 hours from today  Return for suture removal in 5 to 7 days, return sooner if it starts looking red or has drainage  Get your Tdap shot at [facility]     ED Prescriptions     Medication Sig Dispense Auth. Provider   Tdap (BOOSTRIX) 5-2.5-18.5 LF-MCG/0.5 injection Inject 0.5 mLs into the muscle once for 1 dose. 0.5 mL Zenia Resides, MD      PDMP not reviewed this encounter.   Zenia Resides, MD 07/02/22 [phone]

## 2022-07-02 NOTE — ED Notes (Signed)
Cleansed and re wraped wound with nonadherent dressing and coban.

## 2022-11-10 ENCOUNTER — Other Ambulatory Visit: Payer: Self-pay | Admitting: Nurse Practitioner

## 2022-11-10 DIAGNOSIS — Z91018 Allergy to other foods: Secondary | ICD-10-CM

## 2022-11-11 ENCOUNTER — Telehealth: Payer: Medicare Other

## 2022-11-11 DIAGNOSIS — Z91018 Allergy to other foods: Secondary | ICD-10-CM

## 2022-11-11 MED ORDER — EPINEPHRINE 0.3 MG/0.3ML IJ SOAJ: 0.3000 mg | INTRAMUSCULAR | 1 refills | Status: AC | PRN

## 2022-11-11 NOTE — Telephone Encounter (Signed)
Patient called in because he states he went to the pharmacy to get shingles vaccine and was told his insurance will not cover it. Also his epi-pen has expired but he would like a new one he seen on Tv called [employer]. If this is approved he ask does he just throw the old pens in the trash

## 2022-11-11 NOTE — Addendum Note (Signed)
Addended by: Sherrie Mustache K on: 11/11/2022 03:00 PM   Modules accepted: Orders

## 2022-11-11 NOTE — Telephone Encounter (Signed)
I think this is going to depend on what insurance is going to cover for him. It is still an epi-pen, will call in RX.

## 2023-01-19 ENCOUNTER — Encounter: Payer: Self-pay | Admitting: Nurse Practitioner

## 2023-01-22 ENCOUNTER — Encounter: Payer: Self-pay | Admitting: Nurse Practitioner

## 2023-01-22 ENCOUNTER — Ambulatory Visit (INDEPENDENT_AMBULATORY_CARE_PROVIDER_SITE_OTHER): Payer: Medicare Other | Admitting: Nurse Practitioner

## 2023-01-22 VITALS — BP 126/78 | HR 77 | Temp 97.9°F | Ht 72.0 in | Wt 192.0 lb

## 2023-01-22 DIAGNOSIS — Z91018 Allergy to other foods: Secondary | ICD-10-CM | POA: Diagnosis not present

## 2023-01-22 DIAGNOSIS — E785 Hyperlipidemia, unspecified: Secondary | ICD-10-CM

## 2023-01-22 DIAGNOSIS — Z23 Encounter for immunization: Secondary | ICD-10-CM | POA: Diagnosis not present

## 2023-01-22 DIAGNOSIS — N401 Enlarged prostate with lower urinary tract symptoms: Secondary | ICD-10-CM | POA: Diagnosis not present

## 2023-01-22 DIAGNOSIS — Z87891 Personal history of nicotine dependence: Secondary | ICD-10-CM

## 2023-01-22 NOTE — Patient Instructions (Addendum)
1.) Go by your local pharmacy to received your shingrix vaccine and additional covid boosters.   Please schedule fasting blood work for next week.

## 2023-01-22 NOTE — Progress Notes (Signed)
Careteam: Patient Care Team: Lauree Chandler, NP as PCP - General (Geriatric Medicine)  PLACE OF SERVICE:  [place]  Advanced Directive information Does Patient Have a Medical Advance Directive?: Festus Holts, Does patient want to make changes to medical advance directive?: Yes (MAU/Ambulatory/Procedural Areas - Information given)  Allergies  Allergen Reactions   Beef-Derived Products Anaphylaxis   Peanut-Containing Drug Products Hives    Severe itching   Other     Peanut Butter, Anaphylaxis    Rapaflo [Silodosin]     Acute congestion and dizziness   Succinylcholine Other (See Comments)    Family history of pseudocholinesterase deficiency (father and sister)   Penicillins Other (See Comments)    "childhood allergy", he doesn't know reaction     Chief Complaint  Patient presents with   Medical Management of Chronic Issues    Routine visit. Discuss need for shingrix, pneumonia, flu vaccine, and covid boosters or post pone if patient refuses or is not a candidate. NCIR verified.      HPI: Patient is a 67 y.o. male here for routine follow-up.   He has not changed his diet since his last visit. He is not fasting this morning but will come back for fasting blood work.   Does not have any concerns at this time.   Smokes 'every now and then'  Quit smoking in 2005. Smoked about a pack a day since he was 13. Not interested in CT lung screening at this time.   Review of Systems:  Review of Systems  Constitutional:  Negative for chills, fever, malaise/fatigue and weight loss.  HENT:  Negative for congestion and sore throat.   Eyes:  Negative for blurred vision.  Respiratory:  Negative for cough, shortness of breath and wheezing.   Cardiovascular:  Negative for chest pain, palpitations and leg swelling.  Gastrointestinal:  Negative for abdominal pain, blood in stool, constipation, diarrhea, heartburn, nausea and vomiting.  Genitourinary:  Negative for dysuria, frequency,  hematuria and urgency.  Musculoskeletal:  Negative for falls and joint pain.  Skin:  Negative for rash.  Neurological:  Negative for dizziness, tingling and headaches.  Endo/Heme/Allergies:  Negative for polydipsia.  Psychiatric/Behavioral:  Negative for depression. The patient is not nervous/anxious.     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    Allergy    seasonal   Anaphylaxis    Per [facility] Patient Packet    Arthritis    Family history of adverse reaction to anesthesia    Past Surgical History:  Procedure Laterality Date   APPENDECTOMY  1966   Per [facility] Patient Packet    TOTAL HIP ARTHROPLASTY Right 07/26/2020   Procedure: RIGHT TOTAL HIP ARTHROPLASTY ANTERIOR APPROACH;  Surgeon: Mcarthur Rossetti, MD;  Location: [facility];  Service: Orthopedics;  Laterality: Right;   TRANSURETHRAL RESECTION OF PROSTATE  12/26/2020   Social History:   reports that he quit smoking about 19 years ago. His smoking use included cigarettes. He has a 45.00 pack-year smoking history. He has never used smokeless tobacco. He reports current alcohol use. He reports that he does not use drugs.  Family History  Problem Relation Age of Onset   Stroke Mother    Diabetes Mother        borderline   Alzheimer's disease Father    Cancer Sister     Medications: Patient's Medications  New Prescriptions   No medications on file  Previous Medications   ASPIRIN-ACETAMINOPHEN-CAFFEINE (GOODY HEADACHE PO)    Take 0.5 packets by mouth as needed (As needed.).   EPINEPHRINE 0.3 MG/0.3 ML IJ SOAJ INJECTION    Inject 0.3 mg into the muscle as needed for anaphylaxis.  Modified Medications   No medications on file  Discontinued Medications   No medications on file    Physical Exam:  Vitals:   01/22/23 0956  BP: 126/78  Pulse: 77  Temp: 97.9 F (36.6 C)  TempSrc: Temporal  SpO2: 99%  Weight: 192 lb (87.1 kg)   Height: 6' (1.829 m)   Body mass index is 26.04 kg/m. Wt Readings from Last 3 Encounters:  01/22/23 192 lb (87.1 kg)  05/19/22 197 lb 6.4 oz (89.5 kg)  01/19/22 197 lb (89.4 kg)    Physical Exam Vitals reviewed.  Cardiovascular:     Rate and Rhythm: Normal rate and regular rhythm.     Heart sounds: Normal heart sounds. No murmur heard. Pulmonary:     Breath sounds: Normal breath sounds. No wheezing or rales.  Abdominal:     General: Bowel sounds are normal.     Palpations: Abdomen is soft. There is no mass.     Tenderness: There is no abdominal tenderness. There is no guarding.  Musculoskeletal:        General: No swelling or tenderness.  Skin:    General: Skin is warm and dry.  Neurological:     Mental Status: He is alert and oriented to person, place, and time. Mental status is at baseline.  Psychiatric:        Mood and Affect: Mood normal.        Behavior: Behavior normal.        Judgment: Judgment normal.     Labs reviewed: Basic Metabolic Panel: No results for input(s): "NA", "K", "CL", "CO2", "GLUCOSE", "BUN", "CREATININE", "CALCIUM", "MG", "PHOS", "TSH" in the last 8760 hours. Liver Function Tests: No results for input(s): "AST", "ALT", "ALKPHOS", "BILITOT", "PROT", "ALBUMIN" in the last 8760 hours. No results for input(s): "LIPASE", "AMYLASE" in the last 8760 hours. No results for input(s): "AMMONIA" in the last 8760 hours. CBC: No results for input(s): "WBC", "NEUTROABS", "HGB", "HCT", "MCV", "PLT" in the last 8760 hours. Lipid Panel: No results for input(s): "CHOL", "HDL", "LDLCALC", "TRIG", "CHOLHDL", "LDLDIRECT" in the last 8760 hours. TSH: No results for input(s): "TSH" in the last 8760 hours. A1C: No results found for: "HGBA1C"   Assessment/Plan 1. Need for pneumococcal 20-valent conjugate vaccination Pt received today. - Pneumococcal conjugate vaccine 20-valent (Prevnar 20)  2. Allergy to alpha-gal Carries epi pen with him and avoids red  meat.  3. Hyperlipidemia LDL goal <100 Discussed dietary modifications with patient but he says he is unable to change his diet at this time due to being on-the-go with work. He already avoids red meat due to allergies. He does not have time to cook because he is never home. Pt to return for fasting labs. - Lipid panel; Future - COMPLETE METABOLIC PANEL WITH GFR; Future - CBC with Differential/Platelet; Future  4. Benign prostatic hyperplasia with lower urinary tract symptoms, symptom details unspecified Had TURP in the past; no issues with urination at this time and does not follow-up with urology.  5. Hx of smoking Patient has a significant history of smoking. Reports he quits in 2005 but still smokes 'every now and then'.    Return in about 1 year (around 01/22/2024) for yearly follow up .  Student- Archer Asa O'Berry  ACPCNP-S  I personally was present during the history, physical exam and medical decision-making activities of this service and have verified that the service and findings are accurately documented in the student's note Aariv Medlock K. Graves, [facility] [phone]

## 2023-01-26 ENCOUNTER — Other Ambulatory Visit: Payer: Medicare Other

## 2023-01-26 DIAGNOSIS — E785 Hyperlipidemia, unspecified: Secondary | ICD-10-CM

## 2023-01-27 LAB — COMPLETE METABOLIC PANEL WITH GFR
AG Ratio: 1.5 (calc) (ref 1.0–2.5)
ALT: 13 U/L (ref 9–46)
AST: 15 U/L (ref 10–35)
Albumin: 4.3 g/dL (ref 3.6–5.1)
Alkaline phosphatase (APISO): 96 U/L (ref 35–144)
BUN: 20 mg/dL (ref 7–25)
CO2: 25 mmol/L (ref 20–32)
Calcium: 9.6 mg/dL (ref 8.6–10.3)
Chloride: 105 mmol/L (ref 98–110)
Creat: 1.29 mg/dL (ref 0.70–1.35)
Globulin: 2.8 g/dL (calc) (ref 1.9–3.7)
Glucose, Bld: 88 mg/dL (ref 65–99)
Potassium: 4.7 mmol/L (ref 3.5–5.3)
Sodium: 139 mmol/L (ref 135–146)
Total Bilirubin: 0.6 mg/dL (ref 0.2–1.2)
Total Protein: 7.1 g/dL (ref 6.1–8.1)
eGFR: 61 mL/min/{1.73_m2} (ref >=60)

## 2023-01-27 LAB — CBC WITH DIFFERENTIAL/PLATELET
Absolute Monocytes: 626 cells/uL (ref 200–950)
Basophils Absolute: 50 cells/uL (ref 0–200)
Basophils Relative: 0.8 %
Eosinophils Absolute: 242 cells/uL (ref 15–500)
Eosinophils Relative: 3.9 %
HCT: 45.2 % (ref 38.5–50.0)
Hemoglobin: 14.9 g/dL (ref 13.2–17.1)
Lymphs Abs: 1035 cells/uL (ref 850–3900)
MCH: 29.3 pg (ref 27.0–33.0)
MCHC: 33 g/dL (ref 32.0–36.0)
MCV: 89 fL (ref 80.0–100.0)
MPV: 12.3 fL (ref 7.5–12.5)
Monocytes Relative: 10.1 %
Neutro Abs: 4247 cells/uL (ref 1500–7800)
Neutrophils Relative %: 68.5 %
Platelets: 201 10*3/uL (ref 140–400)
RBC: 5.08 10*6/uL (ref 4.20–5.80)
RDW: 14.1 % (ref 11.0–15.0)
Total Lymphocyte: 16.7 %
WBC: 6.2 10*3/uL (ref 3.8–10.8)

## 2023-01-27 LAB — LIPID PANEL
Cholesterol: 220 mg/dL — ABNORMAL HIGH (ref <200)
HDL: 49 mg/dL (ref >=40)
LDL Cholesterol (Calc): 148 mg/dL (calc) — ABNORMAL HIGH
Non-HDL Cholesterol (Calc): 171 mg/dL (calc) — ABNORMAL HIGH (ref <130)
Total CHOL/HDL Ratio: 4.5 (calc) (ref <5.0)
Triglycerides: 109 mg/dL (ref <150)

## 2023-07-09 ENCOUNTER — Encounter: Payer: Self-pay | Admitting: Nurse Practitioner

## 2023-07-09 ENCOUNTER — Telehealth (INDEPENDENT_AMBULATORY_CARE_PROVIDER_SITE_OTHER): Payer: Medicare Other | Admitting: Nurse Practitioner

## 2023-07-09 DIAGNOSIS — Z1211 Encounter for screening for malignant neoplasm of colon: Secondary | ICD-10-CM | POA: Diagnosis not present

## 2023-07-09 DIAGNOSIS — Z0000 Encounter for general adult medical examination without abnormal findings: Secondary | ICD-10-CM | POA: Diagnosis not present

## 2023-07-09 DIAGNOSIS — Z1212 Encounter for screening for malignant neoplasm of rectum: Secondary | ICD-10-CM

## 2023-07-09 NOTE — Progress Notes (Signed)
  This service is provided via telemedicine  No vital signs collected/recorded due to the encounter was a telemedicine visit.   Location of patient (ex: home, work):  Work  Patient consents to a telephone visit:  Yes  Location of the provider (ex: office, home):  [employer].   Name of any referring provider:  [name], NP   Names of all persons participating in the telemedicine service and their role in the encounter:  Patient, Meda Klinefelter, RMA, Abbey Chatters , NP.    Time spent on call: 8 minutes spent on the phone with Medical Assistant.

## 2023-07-09 NOTE — Patient Instructions (Signed)
  Zachary Best , Thank you for taking time to come for your Medicare Wellness Visit. I appreciate your ongoing commitment to your health goals. Please review the following plan we discussed and let me know if I can assist you in the future.  This is a list of the screening recommended for you and due dates:  Health Maintenance  Topic Date Due   Zoster (Shingles) Vaccine (1 of 2) Never done   COVID-19 Vaccine (4 - 2023-24 season) 07/10/2022   Flu Shot  06/10/2023   Cologuard (Stool DNA test)  07/10/2023   Medicare Annual Wellness Visit  07/08/2024   DTaP/Tdap/Td vaccine (2 - Td or Tdap) 07/02/2032   Pneumonia Vaccine  Completed   Hepatitis C Screening  Completed   HPV Vaccine  Aged Out   To get COVID and shingles at your local pharmacy.  Can get flu vaccine at office or pharmacy

## 2023-07-09 NOTE — Progress Notes (Signed)
Subjective:   Zachary Best is a 67 y.o. male who presents for Medicare Annual/Subsequent preventive examination.  Visit Complete: Virtual  I connected with  Zachary Best on 07/09/23 by a video and audio enabled telemedicine application and verified that I am speaking with the correct person using two identifiers.  Patient Location: Home  Provider Location: Office/Clinic  I discussed the limitations of evaluation and management by telemedicine. The patient expressed understanding and agreed to proceed.   Review of Systems     Cardiac Risk Factors include: advanced age (>62men, >21 women);male gender     Objective:    There were no vitals filed for this visit. There is no height or weight on file to calculate BMI.     07/09/2023    2:57 PM 01/22/2023    9:54 AM 06/23/2022   11:06 AM 05/19/2022   11:00 AM 01/19/2022    9:12 AM 06/20/2021   10:40 AM 01/06/2021    9:48 AM  Advanced Directives  Does Patient Have a Medical Advance Directive? No Yes;No No No No No No  Does patient want to make changes to medical advance directive? No - Patient declined Yes (MAU/Ambulatory/Procedural Areas - Information given)     Yes (MAU/Ambulatory/Procedural Areas - Information given)  Would patient like information on creating a medical advance directive? No - Patient declined  Yes (MAU/Ambulatory/Procedural Areas - Information given) No - Patient declined No - Patient declined      Current Medications (verified) Outpatient Encounter Medications as of 07/09/2023  Medication Sig   Aspirin-Acetaminophen-Caffeine (GOODY HEADACHE PO) Take 0.5 packets by mouth as needed (As needed.).   EPINEPHrine 0.3 mg/0.3 mL IJ SOAJ injection Inject 0.3 mg into the muscle as needed for anaphylaxis.   No facility-administered encounter medications on file as of 07/09/2023.    Allergies (verified) Beef-derived products, Peanut-containing drug products, Other, Rapaflo [silodosin], Succinylcholine, and Penicillins    History: 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    Allergy    seasonal   Anaphylaxis    Per [facility] New Patient Packet    Arthritis    Family history of adverse reaction to anesthesia    Past Surgical History:  Procedure Laterality Date   APPENDECTOMY  1966   Per [facility] New Patient Packet    TOTAL HIP ARTHROPLASTY Right 07/26/2020   Procedure: RIGHT TOTAL HIP ARTHROPLASTY ANTERIOR APPROACH;  Surgeon: Kathryne Hitch, MD;  Location: [facility];  Service: Orthopedics;  Laterality: Right;   TRANSURETHRAL RESECTION OF PROSTATE  12/26/2020   Family History  Problem Relation Age of Onset   Stroke Mother    Diabetes Mother        borderline   Alzheimer's disease Father    Cancer Sister    Social History   Socioeconomic History   Marital status: Widowed    Spouse name: Not on file   Number of children: Not on file   Years of education: Not on file   Highest education level: Not on file  Occupational History   Not on file  Tobacco Use   Smoking status: Former    Current packs/day: 0.00    Average packs/day: 1 pack/day for 45.0 years (45.0 ttl pk-yrs)    Types: Cigarettes    Start date: 31    Quit date: 2005    Years since quitting: 19.6   Smokeless tobacco: Never  Vaping Use   Vaping status: Never Used  Substance and Sexual Activity   Alcohol use: Yes    Comment: 1-2 oz per week    Drug use: Never   Sexual activity: Not on file  Other Topics Concern   Not on file  Social History Narrative   Per [facility] New Patient Packet, abstracted 08/11/2019   Diet:      Caffeine: Coffee, 1 cup a day      Married, if yes what year:  Widowed, 1975      Do you live in a house, apartment, assisted living, condo, trailer, ect: house, 1 1/2 stories, 1 person       Pets: No      Current/Past profession: Completed High school.  Conservator, museum/gallery      Exercise: Walking when able,  daily          Living Will: No   DNR: No   POA/HPOA: No      Functional Status:   Do you have difficulty bathing or dressing yourself? No   Do you have difficulty preparing food or eating? No   Do you have difficulty managing your medications? No   Do you have difficulty managing your finances? No   Do you have difficulty affording your medications? No   Social Determinants of Health   Financial Resource Strain: Not on file  Food Insecurity: No Food Insecurity (12/26/2020)   Received from [facility] visits prior to 01/09/2023., [facility] visits prior to 01/09/2023.   Hunger Vital Sign    Worried About Programme researcher, broadcasting/film/video in the Last Year: Never true    Ran Out of Food in the Last Year: Never true  Transportation Needs: Not on file  Physical Activity: Not on file  Stress: Not on file  Social Connections: Not on file    Tobacco Counseling Counseling given: Not Answered   Clinical Intake:  Pre-visit preparation completed: Yes  Pain : No/denies pain     BMI - recorded: 26 Nutritional Status: BMI 25 -29 Overweight Diabetes: No  How often do you need to have someone help you when you read instructions, pamphlets, or other written materials from your doctor or pharmacy?: 1 - Never         Activities of Daily Living    07/09/2023    3:09 PM  In your present state of health, do you have any difficulty performing the following activities:  Hearing? 1  Vision? 1  Difficulty concentrating or making decisions? 0  Walking or climbing stairs? 0  Dressing or bathing? 0  Doing errands, shopping? 0  Preparing Food and eating ? N  Using the Toilet? N  In the past six months, have you accidently leaked urine? N  Do you have problems with loss of bowel control? N  Managing your Medications? N  Managing your Finances? N  Housekeeping or managing your Housekeeping? N    Patient Care Team: [name], NP as PCP - General  (Geriatric Medicine)  Indicate any recent Medical Services you may have received from other than [facility] providers in the past year (date may be approximate).     Assessment:   This is a routine wellness examination for Zachary Best.  Hearing/Vision screen Hearing Screening - Comments:: Some hearing concerns. Patient doesn't have hearing aids.  Vision Screening - Comments:: No vision concerns. Patient wears prescription glasses. Patient last eye exam June 2024.  Dietary issues and exercise activities discussed:     Goals Addressed  None    Depression Screen    07/09/2023    2:54 PM 01/22/2023    9:54 AM 06/23/2022    9:27 AM 01/19/2022    9:59 AM 06/20/2021   10:38 AM 09/04/2020    2:58 PM 06/18/2020    9:06 AM  PHQ 2/9 Scores  PHQ - 2 Score 0 0 0 0 0 0 0    Fall Risk    07/09/2023    2:54 PM 01/22/2023    9:54 AM 06/23/2022    9:27 AM 05/19/2022   11:00 AM 01/19/2022    9:59 AM  Fall Risk   Falls in the past year? 0 0 0 0 0  Number falls in past yr: 0 0 0 0 0  Injury with Fall? 0 0 0 0 0  Risk for fall due to : No Fall Risks No Fall Risks No Fall Risks No Fall Risks No Fall Risks  Follow up Falls evaluation completed;Education provided;Falls prevention discussed Falls evaluation completed Falls evaluation completed Falls evaluation completed Falls evaluation completed    MEDICARE RISK AT HOME:    TIMED UP AND GO:  Was the test performed?  No    Cognitive Function:        07/09/2023    2:55 PM 06/23/2022    9:30 AM 06/20/2021   10:40 AM 06/18/2020    9:09 AM  6CIT Screen  What Year? 0 points 0 points 0 points 0 points  What month? 0 points 0 points 0 points 0 points  What time? 0 points 0 points 0 points 0 points  Count back from 20 0 points 0 points 0 points 0 points  Months in reverse 0 points 0 points 0 points 0 points  Repeat phrase 2 points 0 points 2 points 2 points  Total Score 2 points 0 points 2 points 2 points    Immunizations Immunization History   Administered Date(s) Administered   Fluad Quad(high Dose 65+) 09/04/2020, 01/19/2022   Influenza Split 08/18/2019   Influenza, High Dose Seasonal PF 09/04/2020   Influenza,inj,Quad PF,6+ Mos 08/18/2019   Influenza-Unspecified 08/18/2019   Moderna Covid-19 Vaccine Bivalent Booster 71yrs & up 12/29/2021   Moderna Sars-Covid-2 Vaccination 05/20/2020, 06/19/2020   PNEUMOCOCCAL CONJUGATE-20 01/22/2023   Pneumococcal Conjugate-13 09/04/2020   Tdap 07/02/2022    TDAP status: Up to date  Flu Vaccine status: Due, Education has been provided regarding the importance of this vaccine. Advised may receive this vaccine at local pharmacy or Health Dept. Aware to provide a copy of the vaccination record if obtained from local pharmacy or Health Dept. Verbalized acceptance and understanding.  Pneumococcal vaccine status: Up to date  Covid-19 vaccine status: Information provided on how to obtain vaccines.   Qualifies for Shingles Vaccine? Yes   Zostavax completed No   Shingrix Completed?: No.    Education has been provided regarding the importance of this vaccine. Patient has been advised to call insurance company to determine out of pocket expense if they have not yet received this vaccine. Advised may also receive vaccine at local pharmacy or Health Dept. Verbalized acceptance and understanding.  Screening Tests Health Maintenance  Topic Date Due   Zoster Vaccines- Shingrix (1 of 2) Never done   COVID-19 Vaccine (4 - 2023-24 season) 07/10/2022   INFLUENZA VACCINE  06/10/2023   Fecal DNA (Cologuard)  07/10/2023   Medicare Annual Wellness (AWV)  07/08/2024   DTaP/Tdap/Td (2 - Td or Tdap) 07/02/2032   Pneumonia Vaccine 55+ Years old  Completed   Hepatitis C Screening  Completed   HPV VACCINES  Aged Out    Health Maintenance  Health Maintenance Due  Topic Date Due   Zoster Vaccines- Shingrix (1 of 2) Never done   COVID-19 Vaccine (4 - 2023-24 season) 07/10/2022   INFLUENZA VACCINE   06/10/2023    Colorectal cancer screening: Type of screening: Cologuard. Completed 2021. Repeat every 3 years  Lung Cancer Screening: (Low Dose CT Chest recommended if Age 1-80 years, 20 pack-year currently smoking OR have quit w/in 15years.) does not qualify.   Lung Cancer Screening Referral: na  Additional Screening:  Hepatitis C Screening: does qualify; Completed   Vision Screening: Recommended annual ophthalmology exams for early detection of glaucoma and other disorders of the eye. Is the patient up to date with their annual eye exam?  Yes  Who is the provider or what is the name of the office in which the patient attends annual eye exams? [facility] If pt is not established with a provider, would they like to be referred to a provider to establish care? No .   Dental Screening: Recommended annual dental exams for proper oral hygiene   Community Resource Referral / Chronic Care Management: CRR required this visit?  No   CCM required this visit?  No     Plan:     I have personally reviewed and noted the following in the patient's chart:   Medical and social history Use of alcohol, tobacco or illicit drugs  Current medications and supplements including opioid prescriptions. Patient is not currently taking opioid prescriptions. Functional ability and status Nutritional status Physical activity Advanced directives List of other physicians Hospitalizations, surgeries, and ER visits in previous 12 months Vitals Screenings to include cognitive, depression, and falls Referrals and appointments  In addition, I have reviewed and discussed with patient certain preventive protocols, quality metrics, and best practice recommendations. A written personalized care plan for preventive services as well as general preventive health recommendations were provided to patient.     [name], NP   07/09/2023   After Visit Summary: (MyChart) Due to this being a  telephonic visit, the after visit summary with patients personalized plan was offered to patient via MyChart

## 2023-10-12 LAB — COLOGUARD

## 2023-10-15 NOTE — Progress Notes (Signed)
Discussed with patient. He states he is going to give them a call because he received an email

## 2023-10-28 LAB — COLOGUARD: COLOGUARD: NEGATIVE

## 2024-01-28 ENCOUNTER — Ambulatory Visit (INDEPENDENT_AMBULATORY_CARE_PROVIDER_SITE_OTHER): Payer: Medicare Other | Admitting: Nurse Practitioner

## 2024-01-28 VITALS — BP 132/82 | HR 70 | Temp 97.9°F | Ht 72.0 in | Wt 193.2 lb

## 2024-01-28 DIAGNOSIS — Z91018 Allergy to other foods: Secondary | ICD-10-CM

## 2024-01-28 DIAGNOSIS — Z87891 Personal history of nicotine dependence: Secondary | ICD-10-CM

## 2024-01-28 DIAGNOSIS — H9312 Tinnitus, left ear: Secondary | ICD-10-CM

## 2024-01-28 DIAGNOSIS — E785 Hyperlipidemia, unspecified: Secondary | ICD-10-CM | POA: Diagnosis not present

## 2024-01-28 DIAGNOSIS — H9192 Unspecified hearing loss, left ear: Secondary | ICD-10-CM | POA: Diagnosis not present

## 2024-01-28 NOTE — Progress Notes (Signed)
 Careteam: Patient Care Team: [name], NP as PCP - General (Geriatric Medicine)   PLACE OF SERVICE: [facility]  Advanced Directive information Does Patient Have a Medical Advance Directive?: No, Would patient like information on creating a medical advance directive?: No - Patient declined   Allergies  Allergen Reactions   Beef-Derived Drug Products Anaphylaxis   Peanut-Containing Drug Products Hives    Severe itching   Other     Peanut Butter, Anaphylaxis    Rapaflo [Silodosin]     Acute congestion and dizziness   Succinylcholine Other (See Comments)    Family history of pseudocholinesterase deficiency (father and sister)   Penicillins Other (See Comments)    "childhood allergy", he doesn't know reaction      Chief Complaint  Patient presents with   Medical Management of Chronic Issues    Routine visit. Discussed need for flu (declined), covid, and shingrix vaccines (planning to get at local pharmacy). NCIR verified.      HPI: Patient is a 68 y.o. male presents for yearly follow up.  No concerns at this time. Mostly eating fast food, states he doesn't have time to cook. Works for a [employer], is very active through his job.  Does not smoke daily, but states he has an "occasional cigarette"  Alpha-gal and peanut allergy: very careful with what he eats; has epi pen. No recent reactions.   Reports had chest cold/congestion about 2 weeks ago, but is getting over it.  Left ear has "lower tone" in it, not necessarily a ringing, would like referral to audiologist.   Review of Systems:  Review of Systems  Constitutional:  Negative for chills, fever and weight loss.  Respiratory:  Negative for cough and shortness of breath.   Cardiovascular:  Negative for chest pain, palpitations and leg swelling.  Gastrointestinal:  Negative for abdominal pain, blood in stool, constipation, diarrhea, heartburn, nausea and vomiting.  Genitourinary:  Negative for dysuria,  frequency, hematuria and urgency.  Musculoskeletal:  Negative for joint pain and myalgias.  Neurological:  Negative for dizziness, weakness and headaches.  Psychiatric/Behavioral:  Negative for depression and hallucinations. The patient does not have insomnia.     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    Allergy    seasonal   Anaphylaxis    Per [facility] New Patient Packet    Arthritis    Family history of adverse reaction to anesthesia     Past Surgical History:  Procedure Laterality Date   APPENDECTOMY  1966   Per [facility] New Patient Packet    TOTAL HIP ARTHROPLASTY Right 07/26/2020   Procedure: RIGHT TOTAL HIP ARTHROPLASTY ANTERIOR APPROACH;  Surgeon: Kathryne Hitch, MD;  Location: [facility];  Service: Orthopedics;  Laterality: Right;   TRANSURETHRAL RESECTION OF PROSTATE  12/26/2020     Social History:   reports that he quit smoking about 20 years ago. His smoking use included cigarettes. He started smoking about 65 years ago. He has a 45 pack-year smoking history. He has never used smokeless tobacco. He reports current alcohol use. He reports that he does not use drugs.  Family History  Problem Relation Age of Onset   Stroke Mother    Diabetes Mother        borderline   Alzheimer's disease Father    Cancer Sister      Medications:  Patient's Medications  New Prescriptions   No medications on file  Previous Medications   ASPIRIN-ACETAMINOPHEN-CAFFEINE (GOODY HEADACHE PO)    Take 0.5 packets by mouth as needed (As needed.).   EPINEPHRINE 0.3 MG/0.3 ML IJ SOAJ INJECTION    Inject 0.3 mg into the muscle as needed for anaphylaxis.  Modified Medications   No medications on file  Discontinued Medications   No medications on file     Physical Exam:  Vitals:   01/28/24 0726  BP: 132/82  Pulse: 70  Temp: 97.9 F (36.6 C)  Weight: 193 lb 3.2 oz (87.6 kg)  Height: 6' (1.829 m)    Body mass index is 26.2 kg/m.  Wt Readings from Last 3 Encounters:  01/28/24 193 lb 3.2 oz (87.6 kg)  01/22/23 192 lb (87.1 kg)  05/19/22 197 lb 6.4 oz (89.5 kg)     Physical Exam Constitutional:      Appearance: Normal appearance.  HENT:     Right Ear: Tympanic membrane normal.     Left Ear: Tympanic membrane normal.     Nose: Nose normal.     Mouth/Throat:     Mouth: Mucous membranes are moist.     Pharynx: Oropharynx is clear.  Cardiovascular:     Rate and Rhythm: Normal rate and regular rhythm.     Pulses: Normal pulses.     Heart sounds: Normal heart sounds.  Pulmonary:     Effort: Pulmonary effort is normal.     Breath sounds: Normal breath sounds.  Abdominal:     General: Bowel sounds are normal.     Palpations: Abdomen is soft.  Musculoskeletal:        General: Normal range of motion.  Skin:    General: Skin is warm and dry.  Neurological:     General: No focal deficit present.     Mental Status: He is alert and oriented to person, place, and time. Mental status is at baseline.  Psychiatric:        Mood and Affect: Mood normal.        Behavior: Behavior normal.     Labs reviewed: Basic Metabolic Panel: No results for input(s): "NA", "K", "CL", "CO2", "GLUCOSE", "BUN", "CREATININE", "CALCIUM", "MG", "PHOS", "TSH" in the last 8760 hours. Liver Function Tests: No results for input(s): "AST", "ALT", "ALKPHOS", "BILITOT", "PROT", "ALBUMIN" in the last 8760 hours. No results for input(s): "LIPASE", "AMYLASE" in the last 8760 hours. No results for input(s): "AMMONIA" in the last 8760 hours. CBC: No results for input(s): "WBC", "NEUTROABS", "HGB", "HCT", "MCV", "PLT" in the last 8760 hours. Lipid Panel: No results for input(s): "CHOL", "HDL", "LDLCALC", "TRIG", "CHOLHDL", "LDLDIRECT" in the last 8760 hours. TSH: No results for input(s): "TSH" in the last 8760 hours. A1C: No results found for: "HGBA1C"    Assessment/Plan   1. Hyperlipidemia LDL goal <100 (Primary) -Had  lengthy discussion about possible health problems (risk for CVA) with uncontrolled hyperlipidemia, patient does not want to start medications at this time, verbalizes associated risks  - CBC with Differential/Platelet - Complete Metabolic Panel with eGFR - Lipid panel  2. Hx of smoking -Encouraged smoking cessation, reports "occasional" cigarettes   3. Allergy to alpha-gal -Controlled -Continue dietary modifications, carry EpiPen  4. Hearing loss of left ear, unspecified hearing loss type - Ambulatory referral to ENT  5. Tinnitus, left - Ambulatory referral to ENT   Return in about 1 year (around 01/27/2025) for routine follow up, labs at time of visit.  Rollen Sox, Haroldine Laws MSN-FNP Student -I personally was present during the history,  physical exam and medical decision-making activities of this service and have verified that the service and findings are accurately documented in the student's note Bach Rocchi K. Biagio Borg [facility] [phone]

## 2024-01-29 LAB — CBC WITH DIFFERENTIAL/PLATELET
Absolute Lymphocytes: 1246 {cells}/uL (ref 850–3900)
Absolute Monocytes: 690 {cells}/uL (ref 200–950)
Basophils Absolute: 60 {cells}/uL (ref 0–200)
Basophils Relative: 0.9 %
Eosinophils Absolute: 147 {cells}/uL (ref 15–500)
Eosinophils Relative: 2.2 %
HCT: 44.2 % (ref 38.5–50.0)
Hemoglobin: 15.1 g/dL (ref 13.2–17.1)
MCH: 30.4 pg (ref 27.0–33.0)
MCHC: 34.2 g/dL (ref 32.0–36.0)
MCV: 89.1 fL (ref 80.0–100.0)
MPV: 12.3 fL (ref 7.5–12.5)
Monocytes Relative: 10.3 %
Neutro Abs: 4556 {cells}/uL (ref 1500–7800)
Neutrophils Relative %: 68 %
Platelets: 208 10*3/uL (ref 140–400)
RBC: 4.96 10*6/uL (ref 4.20–5.80)
RDW: 13 % (ref 11.0–15.0)
Total Lymphocyte: 18.6 %
WBC: 6.7 10*3/uL (ref 3.8–10.8)

## 2024-01-29 LAB — COMPLETE METABOLIC PANEL WITH GFR
AG Ratio: 1.5 (calc) (ref 1.0–2.5)
ALT: 9 U/L (ref 9–46)
AST: 12 U/L (ref 10–35)
Albumin: 4.1 g/dL (ref 3.6–5.1)
Alkaline phosphatase (APISO): 88 U/L (ref 35–144)
BUN: 16 mg/dL (ref 7–25)
CO2: 24 mmol/L (ref 20–32)
Calcium: 9.3 mg/dL (ref 8.6–10.3)
Chloride: 105 mmol/L (ref 98–110)
Creat: 1.25 mg/dL (ref 0.70–1.35)
Globulin: 2.8 g/dL (ref 1.9–3.7)
Glucose, Bld: 90 mg/dL (ref 65–99)
Potassium: 4.8 mmol/L (ref 3.5–5.3)
Sodium: 138 mmol/L (ref 135–146)
Total Bilirubin: 0.8 mg/dL (ref 0.2–1.2)
Total Protein: 6.9 g/dL (ref 6.1–8.1)

## 2024-01-29 LAB — LIPID PANEL
Cholesterol: 234 mg/dL — ABNORMAL HIGH (ref <200)
HDL: 46 mg/dL (ref >=40)
LDL Cholesterol (Calc): 166 mg/dL — ABNORMAL HIGH
Non-HDL Cholesterol (Calc): 188 mg/dL — ABNORMAL HIGH (ref <130)
Total CHOL/HDL Ratio: 5.1 (calc) — ABNORMAL HIGH (ref <5.0)
Triglycerides: 108 mg/dL (ref <150)

## 2024-01-31 ENCOUNTER — Encounter: Payer: Self-pay | Admitting: Nurse Practitioner

## 2024-04-26 ENCOUNTER — Telehealth: Payer: Self-pay

## 2024-04-26 NOTE — Telephone Encounter (Signed)
 Please advise on how you would like for me to respond to patient

## 2024-04-26 NOTE — Telephone Encounter (Signed)
 We do not remove cyst, if something needs to be drained we are able to do that.

## 2024-04-26 NOTE — Telephone Encounter (Signed)
Call returned to patient and/or family member, a detailed message was left with the providers reply.

## 2024-04-26 NOTE — Telephone Encounter (Signed)
 Copied from CRM [id number]. Topic: Appointments - Scheduling Inquiry for Clinic >> Apr 26, 2024 11:16 AM Maryln Sober wrote: Reason for CRM: Patient would like a call back at [phone] to confirm if the clinics preforms cyst removal.

## 2024-04-27 NOTE — Progress Notes (Unsigned)
  [address] [place], [place] [address] [phone]  Audiological Evaluation    Name: Zachary Best     DOB:   December 02, 1955      MRN:   [id number]                                                                                     Service Date: 04/28/2024     Accompanied by: unccompanied   Patient comes today after Dr. Darlin Ehrlich, ENT sent a referral for a hearing evaluation due to concerns with hearing loss.   Symptoms Yes Details  Hearing loss  [x]   Audiogram from 2021 completed at [facility] shows left hearing asymmetry.  Tinnitus  []    Ear pain/ infections/pressure  []    Balance problems  []    Noise exposure history  []    Previous ear surgeries  []    Family history of hearing loss  []    Amplification  []    Other  []      Otoscopy: Right ear: Clear external ear canal and notable landmarks visualized on the tympanic membrane. Left ear:  Clear external ear canal and notable landmarks visualized on the tympanic membrane.  Tympanometry: Right ear: Type As- Normal external ear canal volume with normal middle ear pressure and low tympanic membrane compliance. Left ear: Type As- Normal external ear canal volume with normal middle ear pressure and low tympanic membrane compliance  Pure tone Audiometry:   Normal to moderately severe sensorineural hearing loss from 125 Hz - 8000 Hz., bilaterally, worse in the left ear. Of note, left hearing asymmetry continues to be observed from [phone] Hz.  Speech Audiometry: Right ear- Speech Reception Threshold (SRT) was obtained at 25 dBHL. Left ear-Speech Reception Threshold (SRT) was obtained at 40 dBHL.   Word Recognition Score Tested using NU-6 (recorded) Right ear: 100% was obtained at a presentation level of 70 dBHL with contralateral masking which is deemed as  excellent. Left ear: 100% was obtained at a presentation level of 80 dBHL with contralateral masking which is deemed as  excellent.   The hearing test results were  completed under headphones and re-checked with inserts and results are deemed to be of good reliability. Test technique:  conventional    Recommendations: Follow up with ENT as scheduled for today. Return for a hearing evaluation if concerns with hearing changes arise or per MD recommendation. Consider various tinnitus strategies, including the use of a sound generator, hearing aids, and/or tinnitus retraining therapy. Consider a communication needs assessment after medical clearance for hearing aids is obtained.   Mitsuo Budnick MARIE LEROUX-MARTINEZ, AUD

## 2024-04-28 ENCOUNTER — Ambulatory Visit (INDEPENDENT_AMBULATORY_CARE_PROVIDER_SITE_OTHER): Admitting: Otolaryngology

## 2024-04-28 ENCOUNTER — Encounter (INDEPENDENT_AMBULATORY_CARE_PROVIDER_SITE_OTHER): Payer: Self-pay | Admitting: Otolaryngology

## 2024-04-28 ENCOUNTER — Ambulatory Visit (INDEPENDENT_AMBULATORY_CARE_PROVIDER_SITE_OTHER): Admitting: Audiology

## 2024-04-28 VITALS — BP 138/81 | HR 66 | Ht 72.0 in | Wt 190.0 lb

## 2024-04-28 DIAGNOSIS — H9319 Tinnitus, unspecified ear: Secondary | ICD-10-CM | POA: Diagnosis not present

## 2024-04-28 DIAGNOSIS — H903 Sensorineural hearing loss, bilateral: Secondary | ICD-10-CM

## 2024-04-28 DIAGNOSIS — H9313 Tinnitus, bilateral: Secondary | ICD-10-CM

## 2024-04-30 DIAGNOSIS — H9313 Tinnitus, bilateral: Secondary | ICD-10-CM | POA: Insufficient documentation

## 2024-04-30 DIAGNOSIS — H903 Sensorineural hearing loss, bilateral: Secondary | ICD-10-CM | POA: Insufficient documentation

## 2024-04-30 NOTE — Progress Notes (Signed)
 CC: Progressive hearing loss, tinnitus  HPI:  Zachary Best is a 68 y.o. male who presents today complaining of bilateral progressive hearing loss for the past 5 years.  His hearing loss is worse on the left side.  He also complains of left greater than right tinnitus.  The tinnitus is high-pitched and nonpulsatile.  The patient has no recent otitis media or otitis externa.  He has no previous otologic surgery.  He has never worn hearing aids.  Currently he denies any otalgia, otorrhea, or vertigo.  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    Allergy    seasonal   Anaphylaxis    Per [facility] New Patient Packet    Arthritis    Family history of adverse reaction to anesthesia     Past Surgical History:  Procedure Laterality Date   APPENDECTOMY  1966   Per [facility] New Patient Packet    TOTAL HIP ARTHROPLASTY Right 07/26/2020   Procedure: RIGHT TOTAL HIP ARTHROPLASTY ANTERIOR APPROACH;  Surgeon: Vernetta Lonni GRADE, MD;  Location: [facility];  Service: Orthopedics;  Laterality: Right;   TRANSURETHRAL RESECTION OF PROSTATE  12/26/2020    Family History  Problem Relation Age of Onset   Stroke Mother    Diabetes Mother        borderline   Alzheimer's disease Father    Cancer Sister     Social History:  reports that he quit smoking about 20 years ago. His smoking use included cigarettes. He started smoking about 65 years ago. He has a 45 pack-year smoking history. He has never used smokeless tobacco. He reports current alcohol use. He reports that he does not use drugs.  Allergies:  Allergies  Allergen Reactions   Beef-Derived Drug Products Anaphylaxis   Peanut-Containing Drug Products Hives    Severe itching   Other     Peanut Butter, Anaphylaxis    Rapaflo [Silodosin]     Acute congestion and dizziness   Succinylcholine Other (See Comments)    Family history of pseudocholinesterase deficiency  (father and sister)   Penicillins Other (See Comments)    childhood allergy, he doesn't know reaction     Prior to Admission medications   Medication Sig Start Date End Date Taking? Authorizing Provider  Aspirin -Acetaminophen -Caffeine (GOODY HEADACHE PO) Take 0.5 packets by mouth as needed (As needed.).   Yes [provider]  EPINEPHrine  0.3 mg/0.3 mL IJ SOAJ injection Inject 0.3 mg into the muscle as needed for anaphylaxis. 11/11/22  Yes Caro Harlene POUR, NP    Blood pressure 138/81, pulse 66, height 6' (1.829 m), weight 190 lb (86.2 kg), SpO2 96%. Exam: General: Communicates without difficulty, well nourished, no acute distress. Head: Normocephalic, no evidence injury, no tenderness, facial buttresses intact without stepoff. Face/sinus: No tenderness to palpation and percussion. Facial movement is normal and symmetric. Eyes: PERRL, EOMI. No scleral icterus, conjunctivae clear. Neuro: CN II exam reveals vision grossly intact.  No nystagmus at any point of gaze. Ears: Auricles well formed without lesions.  Ear canals are intact without mass or lesion.  No erythema or edema is appreciated.  The TMs are intact without fluid. Nose: External evaluation reveals normal support and skin without lesions.  Dorsum is intact.  Anterior rhinoscopy reveals normal mucosa over anterior aspect of inferior turbinates and intact septum.  No purulence noted. Oral:  Oral cavity and oropharynx are intact, symmetric, without erythema or edema.  Mucosa is moist  without lesions. Neck: Full range of motion without pain.  There is no significant lymphadenopathy.  No masses palpable.  Thyroid bed within normal limits to palpation.  Parotid glands and submandibular glands equal bilaterally without mass.  Trachea is midline. Neuro:  CN 2-12 grossly intact.   His hearing test shows bilateral high-frequency sensorineural hearing loss, worse on the left side.  Assessment: 1.  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 2.  The patient's tinnitus is likely a result of his hearing loss. 3.  His ear canals, tympanic membranes, and middle ear spaces are normal.  Plan: 1.  The physical exam findings and the hearing test results are reviewed with the patient. 2.  The small possibility of a retrocochlear lesion causing the asymmetric hearing loss is discussed.  The options of conservative observation versus MRI scan are reviewed.  The patient would like to proceed with the MRI scan. 3.  The strategies to cope with tinnitus, including the use of masker, hearing aids, tinnitus retraining therapy, and avoidance of caffeine and alcohol are discussed. 4.  The patient will return for reevaluation in 6 months, sooner if abnormality is noted on the MRI scan.  Trenell Moxey W Pietro Bonura 04/30/2024, 8:06 AM

## 2024-05-01 NOTE — Telephone Encounter (Signed)
 Would recommend appt to determine if dermatology vs surgery is needed Also he does not need referral for dermatology but if we place one need documentation behind referral

## 2024-05-01 NOTE — Telephone Encounter (Signed)
 Patient notified and agreed. Patient stated that he is going to call the Dermatology office and schedule an appointment.

## 2024-05-01 NOTE — Telephone Encounter (Signed)
 Copied from CRM [id number]. Topic: Referral - Request for Referral >> Apr 28, 2024 12:43 PM Adrianna P wrote: Did the patient discuss referral with their provider in the last year? Yes (If No - schedule appointment) (If Yes - send message)  Appointment offered? No  Type of order/referral and detailed reason for visit: Dermatology, for a sebaceous cyst  Preference of office, provider, location: n/a  If referral order, have you been seen by this specialty before? No (If Yes, this issue or another issue? When? Where?  Can we respond through MyChart? No     Patient requesting a referral to Dermatologist.

## 2024-05-06 ENCOUNTER — Ambulatory Visit (HOSPITAL_COMMUNITY)
Admission: RE | Admit: 2024-05-06 | Discharge: 2024-05-06 | Disposition: A | Discharge status: Home / Self Care | Source: Ambulatory Visit | Attending: Otolaryngology | Admitting: Otolaryngology

## 2024-05-06 DIAGNOSIS — H903 Sensorineural hearing loss, bilateral: Secondary | ICD-10-CM | POA: Diagnosis present

## 2024-05-06 MED ORDER — GADOBUTROL 1 MMOL/ML IV SOLN
9.0000 mL | Freq: Once | INTRAVENOUS | Status: AC | PRN
  Administered 2024-05-06: 9 mL via INTRAVENOUS

## 2024-07-11 ENCOUNTER — Telehealth: Payer: Self-pay | Admitting: *Deleted

## 2024-07-11 ENCOUNTER — Encounter: Payer: Self-pay | Admitting: Nurse Practitioner

## 2024-07-11 ENCOUNTER — Ambulatory Visit (INDEPENDENT_AMBULATORY_CARE_PROVIDER_SITE_OTHER): Payer: Medicare Other | Admitting: Nurse Practitioner

## 2024-07-11 DIAGNOSIS — Z0000 Encounter for general adult medical examination without abnormal findings: Secondary | ICD-10-CM | POA: Diagnosis not present

## 2024-07-11 NOTE — Progress Notes (Signed)
  This service is provided via telemedicine  No vital signs collected/recorded due to the encounter was a telemedicine visit.   Location of patient (ex: home, work):  Home  Patient consents to a telephone visit:  Yes  Location of the provider (ex: office, home):  Office [place].   Name of any referring provider:  na  Names of all persons participating in the telemedicine service and their role in the encounter:  Zachary Best, Patient, Zachary Best, CMA, Harlene An, NP  Time spent on call:  7:10

## 2024-07-11 NOTE — Telephone Encounter (Signed)
 [name], Zachary Best are scheduled for a virtual visit with your provider today.    Just as we do with appointments in the office, we must obtain your consent to participate.  Your consent will be active for this visit and any virtual visit you Zachary Best have with one of our providers in the next 365 days.    If you have a MyChart account, I can also send a copy of this consent to you electronically.  All virtual visits are billed to your insurance company just like a traditional visit in the office.  As this is a virtual visit, video technology does not allow for your provider to perform a traditional examination.  This Zachary Best limit your provider's ability to fully assess your condition.  If your provider identifies any concerns that need to be evaluated in person or the need to arrange testing such as labs, EKG, etc, we will make arrangements to do so.    Although advances in technology are sophisticated, we cannot ensure that it will always work on either your end or our end.  If the connection with a video visit is poor, we Zachary Best have to switch to a telephone visit.  With either a video or telephone visit, we are not always able to ensure that we have a secure connection.   I need to obtain your verbal consent now.   Are you willing to proceed with your visit today?   Zachary Best has provided verbal consent on 07/11/2024 for a virtual visit (video or telephone).   MayDonzell LABOR, CMA 07/11/2024  10:03 AM

## 2024-07-11 NOTE — Progress Notes (Signed)
 Subjective:   Zachary Best is a 68 y.o. male who presents for Medicare Annual/Subsequent preventive examination.  Visit Complete: Virtual I connected with  Alm Favorite on 07/11/24 by a video and audio enabled telemedicine application and verified that I am speaking with the correct person using two identifiers.  Patient Location: Home  Provider Location: Office/Clinic  I discussed the limitations of evaluation and management by telemedicine. The patient expressed understanding and agreed to proceed.  Vital Signs: Because this visit was a virtual/telehealth visit, some criteria may be missing or patient reported. Any vitals not documented were not able to be obtained and vitals that have been documented are patient reported.   Cardiac Risk Factors include: advanced age (>69men, >59 women);male gender     Objective:    There were no vitals filed for this visit. There is no height or weight on file to calculate BMI.     07/11/2024    9:01 AM 01/28/2024    7:27 AM 07/09/2023    2:57 PM 01/22/2023    9:54 AM 06/23/2022   11:06 AM 05/19/2022   11:00 AM 01/19/2022    9:12 AM  Advanced Directives  Does Patient Have a Medical Advance Directive? No No No Yes;No No No No  Does patient want to make changes to medical advance directive?   No - Patient declined Yes (MAU/Ambulatory/Procedural Areas - Information given)     Would patient like information on creating a medical advance directive? No - Patient declined No - Patient declined No - Patient declined  Yes (MAU/Ambulatory/Procedural Areas - Information given) No - Patient declined No - Patient declined    Current Medications (verified) Outpatient Encounter Medications as of 07/11/2024  Medication Sig   Aspirin -Acetaminophen -Caffeine (GOODY HEADACHE PO) Take 0.5 packets by mouth as needed (As needed.).   EPINEPHrine  0.3 mg/0.3 mL IJ SOAJ injection Inject 0.3 mg into the muscle as needed for anaphylaxis.   No facility-administered  encounter medications on file as of 07/11/2024.    Allergies (verified) Beef-derived drug products, Peanut-containing drug products, Other, Rapaflo [silodosin], Succinylcholine, and Penicillins   History: 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    Allergy    seasonal   Anaphylaxis    Per [facility] New Patient Packet    Arthritis    Family history of adverse reaction to anesthesia    Past Surgical History:  Procedure Laterality Date   APPENDECTOMY  1966   Per [facility] New Patient Packet    TOTAL HIP ARTHROPLASTY Right 07/26/2020   Procedure: RIGHT TOTAL HIP ARTHROPLASTY ANTERIOR APPROACH;  Surgeon: Vernetta Lonni GRADE, MD;  Location: [facility]S;  Service: Orthopedics;  Laterality: Right;   TRANSURETHRAL RESECTION OF PROSTATE  12/26/2020   Family History  Problem Relation Age of Onset   Stroke Mother    Diabetes Mother        borderline   Alzheimer's disease Father    Cancer Sister    Social History   Socioeconomic History   Marital status: Widowed    Spouse name: Not on file   Number of children: Not on file   Years of education: Not on file   Highest education level: Associate degree: occupational, Scientist, product/process development, or vocational program  Occupational History   Not on file  Tobacco Use   Smoking status: Former    Current packs/day: 0.00    Average packs/day: 1 pack/day for 45.0 years (45.0 ttl pk-yrs)  Types: Cigarettes    Start date: 4    Quit date: 2005    Years since quitting: 20.6   Smokeless tobacco: Never  Vaping Use   Vaping status: Never Used  Substance and Sexual Activity   Alcohol use: Yes    Comment: 1-2 oz per week    Drug use: Never   Sexual activity: Not on file  Other Topics Concern   Not on file  Social History Narrative   Per [facility] New Patient Packet, abstracted 08/11/2019   Diet:      Caffeine: Coffee, 1 cup a day      Married, if yes what year:  Widowed, 1975       Do you live in a house, apartment, assisted living, condo, trailer, ect: house, 1 1/2 stories, 1 person       Pets: No      Current/Past profession: Completed High school.  Conservator, museum/gallery      Exercise: Walking when able, daily          Living Will: No   DNR: No   POA/HPOA: No      Functional Status:   Do you have difficulty bathing or dressing yourself? No   Do you have difficulty preparing food or eating? No   Do you have difficulty managing your medications? No   Do you have difficulty managing your finances? No   Do you have difficulty affording your medications? No   Social Drivers of Corporate investment banker Strain: Low Risk  (07/09/2024)   Overall Financial Resource Strain (CARDIA)    Difficulty of Paying Living Expenses: Not hard at all  Food Insecurity: No Food Insecurity (07/09/2024)   Hunger Vital Sign    Worried About Running Out of Food in the Last Year: Never true    Ran Out of Food in the Last Year: Never true  Transportation Needs: No Transportation Needs (07/09/2024)   PRAPARE - Administrator, Civil Service (Medical): No    Lack of Transportation (Non-Medical): No  Physical Activity: Sufficiently Active (07/09/2024)   Exercise Vital Sign    Days of Exercise per Week: 7 days    Minutes of Exercise per Session: 150+ min  Stress: No Stress Concern Present (07/09/2024)   [employer] of Occupational Health - Occupational Stress Questionnaire    Feeling of Stress: Not at all  Social Connections: Socially Isolated (07/09/2024)   Social Connection and Isolation Panel    Frequency of Communication with Friends and Family: More than three times a week    Frequency of Social Gatherings with Friends and Family: Twice a week    Attends Religious Services: Patient declined    Database administrator or Organizations: No    Attends Engineer, structural: Not on file    Marital Status: Widowed    Tobacco  Counseling Counseling given: Not Answered   Clinical Intake:  Pre-visit preparation completed: Yes  Pain : No/denies pain     BMI - recorded: 25 Nutritional Status: BMI 25 -29 Overweight Nutritional Risks: None  How often do you need to have someone help you when you read instructions, pamphlets, or other written materials from your doctor or pharmacy?: 1 - Never         Activities of Daily Living    07/11/2024    9:28 AM 07/09/2024    4:41 PM  In your present state of health, do you have any difficulty performing the following  activities:  Hearing? 0 0  Vision? 0 0  Difficulty concentrating or making decisions? 0 0  Walking or climbing stairs? 0 0  Dressing or bathing? 0 0  Doing errands, shopping? 0 0  Preparing Food and eating ? N N  Using the Toilet? N N  In the past six months, have you accidently leaked urine? N N  Do you have problems with loss of bowel control? N N  Managing your Medications? N N  Managing your Finances? N N  Housekeeping or managing your Housekeeping? N N    Patient Care Team: Caro Harlene POUR, NP as PCP - General (Geriatric Medicine)  Indicate any recent Medical Services you may have received from other than [facility] providers in the past year (date may be approximate).     Assessment:   This is a routine wellness examination for Gaspare.  Hearing/Vision screen Vision Screening - Comments:: [facility] on [address] Last Exam: 06/2024   Goals Addressed   None    Depression Screen    07/11/2024    9:00 AM 01/28/2024    7:27 AM 07/09/2023    2:54 PM 01/22/2023    9:54 AM 06/23/2022    9:27 AM 01/19/2022    9:59 AM 06/20/2021   10:38 AM  PHQ 2/9 Scores  PHQ - 2 Score 0 0 0 0 0 0 0    Fall Risk    07/11/2024    9:00 AM 07/09/2024    4:41 PM 01/28/2024    7:27 AM 07/09/2023    2:54 PM 01/22/2023    9:54 AM  Fall Risk   Falls in the past year? 0 0 0 0 0  Number falls in past yr: 0  0 0 0  Injury with Fall? 0  0 0 0  Risk for fall  due to : No Fall Risks  No Fall Risks No Fall Risks No Fall Risks  Follow up Falls evaluation completed  Falls evaluation completed Falls evaluation completed;Education provided;Falls prevention discussed Falls evaluation completed    MEDICARE RISK AT HOME: Medicare Risk at Home Any stairs in or around the home?: Yes If so, are there any without handrails?: No Home free of loose throw rugs in walkways, pet beds, electrical cords, etc?: Yes Adequate lighting in your home to reduce risk of falls?: Yes Life alert?: No Use of a cane, walker or w/c?: No Grab bars in the bathroom?: Yes Shower chair or bench in shower?: No Elevated toilet seat or a handicapped toilet?: No  TIMED UP AND GO:  Was the test performed?  No    Cognitive Function:        07/11/2024    9:02 AM 07/09/2023    2:55 PM 06/23/2022    9:30 AM 06/20/2021   10:40 AM 06/18/2020    9:09 AM  6CIT Screen  What Year? 0 points 0 points 0 points 0 points 0 points  What month? 0 points 0 points 0 points 0 points 0 points  What time? 0 points 0 points 0 points 0 points 0 points  Count back from 20 0 points 0 points 0 points 0 points 0 points  Months in reverse 0 points 0 points 0 points 0 points 0 points  Repeat phrase 0 points 2 points 0 points 2 points 2 points  Total Score 0 points 2 points 0 points 2 points 2 points    Immunizations Immunization History  Administered Date(s) Administered   Fluad Quad(high Dose 65+) 09/04/2020, 01/19/2022  INFLUENZA, HIGH DOSE SEASONAL PF 09/04/2020   Influenza Split 08/18/2019   Influenza,inj,Quad PF,6+ Mos 08/18/2019   Influenza-Unspecified 08/18/2019   Moderna Covid-19 Vaccine Bivalent Booster 11yrs & up 12/29/2021   Moderna Sars-Covid-2 Vaccination 05/20/2020, 06/19/2020   PNEUMOCOCCAL CONJUGATE-20 01/22/2023   Pneumococcal Conjugate-13 09/04/2020   Tdap 07/02/2022   Unspecified SARS-COV-2 Vaccination 10/24/2022    TDAP status: Up to date  Flu Vaccine status: Due,  Education has been provided regarding the importance of this vaccine. Advised may receive this vaccine at local pharmacy or Health Dept. Aware to provide a copy of the vaccination record if obtained from local pharmacy or Health Dept. Verbalized acceptance and understanding.  Pneumococcal vaccine status: Up to date  Covid-19 vaccine status: Information provided on how to obtain vaccines.   Qualifies for Shingles Vaccine? Yes   Zostavax completed No   Shingrix Completed?: No.    Education has been provided regarding the importance of this vaccine. Patient has been advised to call insurance company to determine out of pocket expense if they have not yet received this vaccine. Advised may also receive vaccine at local pharmacy or Health Dept. Verbalized acceptance and understanding.  Screening Tests Health Maintenance  Topic Date Due   Zoster Vaccines- Shingrix (1 of 2) Never done   INFLUENZA VACCINE  06/09/2024   COVID-19 Vaccine (5 - 2025-26 season) 07/10/2024   Medicare Annual Wellness (AWV)  07/11/2025   Fecal DNA (Cologuard)  10/18/2026   DTaP/Tdap/Td (2 - Td or Tdap) 07/02/2032   Pneumococcal Vaccine: 50+ Years  Completed   Hepatitis C Screening  Completed   HPV VACCINES  Aged Out   Meningococcal B Vaccine  Aged Out    Health Maintenance  Health Maintenance Due  Topic Date Due   Zoster Vaccines- Shingrix (1 of 2) Never done   INFLUENZA VACCINE  06/09/2024   COVID-19 Vaccine (5 - 2025-26 season) 07/10/2024    Colorectal cancer screening: Type of screening: Cologuard. Completed 07/03/2023. Repeat every 3 years  Lung Cancer Screening: (Low Dose CT Chest recommended if Age 36-80 years, 20 pack-year currently smoking OR have quit w/in 15years.) does not qualify.   Lung Cancer Screening Referral: na  Additional Screening:  Hepatitis C Screening: does qualify; Completed   Vision Screening: Recommended annual ophthalmology exams for early detection of glaucoma and other  disorders of the eye. Is the patient up to date with their annual eye exam?  Yes  Who is the provider or what is the name of the office in which the patient attends annual eye exams? [facility]  If pt is not established with a provider, would they like to be referred to a provider to establish care? No .   Dental Screening: Recommended annual dental exams for proper oral hygiene  Community Resource Referral / Chronic Care Management: CRR required this visit?  No   CCM required this visit?  No     Plan:     I have personally reviewed and noted the following in the patient's chart:   Medical and social history Use of alcohol, tobacco or illicit drugs  Current medications and supplements including opioid prescriptions. Patient is not currently taking opioid prescriptions. Functional ability and status Nutritional status Physical activity Advanced directives List of other physicians Hospitalizations, surgeries, and ER visits in previous 12 months Vitals Screenings to include cognitive, depression, and falls Referrals and appointments  In addition, I have reviewed and discussed with patient certain preventive protocols, quality metrics, and best practice recommendations.  A written personalized care plan for preventive services as well as general preventive health recommendations were provided to patient.     Harlene MARLA An, NP   07/11/2024   After Visit Summary: (MyChart) Due to this being a telephonic visit, the after visit summary with patients personalized plan was offered to patient via MyChart

## 2024-07-11 NOTE — Patient Instructions (Signed)
  [name] , Thank you for taking time to come for your Medicare Wellness Visit. I appreciate your ongoing commitment to your health goals. Please review the following plan we discussed and let me know if I can assist you in the future.   To get shingles, covid booster and flu shot at your local pharmacy   This is a list of the screening recommended for you and due dates:  Health Maintenance  Topic Date Due   Zoster (Shingles) Vaccine (1 of 2) Never done   Flu Shot  06/09/2024   COVID-19 Vaccine (5 - 2025-26 season) 07/10/2024   Medicare Annual Wellness Visit  07/11/2025   Cologuard (Stool DNA test)  10/18/2026   DTaP/Tdap/Td vaccine (2 - Td or Tdap) 07/02/2032   Pneumococcal Vaccine for age over 54  Completed   Hepatitis C Screening  Completed   HPV Vaccine  Aged Out   Meningitis B Vaccine  Aged Out

## 2024-10-11 ENCOUNTER — Ambulatory Visit: Admitting: Orthopaedic Surgery

## 2024-10-11 ENCOUNTER — Other Ambulatory Visit: Payer: Self-pay

## 2024-10-11 DIAGNOSIS — M25511 Pain in right shoulder: Secondary | ICD-10-CM

## 2024-10-11 DIAGNOSIS — G8929 Other chronic pain: Secondary | ICD-10-CM | POA: Diagnosis not present

## 2024-10-11 MED ORDER — METHYLPREDNISOLONE ACETATE 40 MG/ML IJ SUSP
40.0000 mg | INTRAMUSCULAR | Status: AC | PRN
  Administered 2024-10-11: 40 mg via INTRA_ARTICULAR

## 2024-10-11 MED ORDER — LIDOCAINE HCL 1 % IJ SOLN
3.0000 mL | INTRAMUSCULAR | Status: AC | PRN
  Administered 2024-10-11: 3 mL

## 2024-10-11 NOTE — Progress Notes (Signed)
 The 68 year old gentleman well-known to us .  We replaced his right hip back in 2021.  We did see him for his shoulder about almost 5 years ago on that right shoulder.  Has been bothering him quite a bit recently and getting worse.  Is waking him up at night and has a constant pain.  Recently he did have some type of injury to his left forearm with a lot of bruising and something popped but the bruising is subsided.  On exam his range of motion of his right shoulder is limited.  His external rotation is not full and his internal rotation with adduction is limited as well as overhead motion.  Is very stiff.  I can feel grinding at the glenohumeral joint.  His left shoulder exam is normal.  His left forearm exam today is normal and his distal biceps tendon is intact.  X-rays of his right shoulder today were compared to x-rays from almost 5 years ago.  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  The humeral head is not high riding.  I did place a steroid injection of the right shoulder subacromial space today per his request.  I would like to send him next to my partner Dr. Addie to have him evaluate him for the potential of a right shoulder replacement.  The patient would like to wait about 6 months if he can but I do feel it is worth seeing Dr. Addie soon to consider an intra-articular injection under ultrasound in the right shoulder glenohumeral joint.  The patient did agree to that.  He tolerated the subacromial steroid injection well today.  He is not a diabetic.  All question concerns were answered and addressed.    Procedure Note  Patient: Zachary Best             Date of Birth: [birth year]           MRN: [id number]             Visit Date: 10/11/2024  Procedures: Visit Diagnoses:  1. Chronic right shoulder pain     Large Joint Inj: R subacromial bursa on 10/11/2024 11:30 AM Indications: pain and diagnostic evaluation Details: 22 G 1.5 in needle  Arthrogram:  No  Medications: 3 mL lidocaine 1 %; 40 mg methylPREDNISolone acetate 40 MG/ML Outcome: tolerated well, no immediate complications Procedure, treatment alternatives, risks and benefits explained, specific risks discussed. Consent was given by the patient. Immediately prior to procedure a time out was called to verify the correct patient, procedure, equipment, support staff and site/side marked as required. Patient was prepped and draped in the usual sterile fashion.

## 2024-10-20 ENCOUNTER — Other Ambulatory Visit (INDEPENDENT_AMBULATORY_CARE_PROVIDER_SITE_OTHER): Payer: Self-pay | Admitting: Otolaryngology

## 2024-10-20 DIAGNOSIS — H903 Sensorineural hearing loss, bilateral: Secondary | ICD-10-CM

## 2024-10-23 ENCOUNTER — Encounter (INDEPENDENT_AMBULATORY_CARE_PROVIDER_SITE_OTHER): Payer: Self-pay | Admitting: Otolaryngology

## 2024-10-23 ENCOUNTER — Ambulatory Visit (INDEPENDENT_AMBULATORY_CARE_PROVIDER_SITE_OTHER): Admitting: Audiology

## 2024-10-23 ENCOUNTER — Ambulatory Visit (INDEPENDENT_AMBULATORY_CARE_PROVIDER_SITE_OTHER): Admitting: Otolaryngology

## 2024-10-23 VITALS — BP 123/80 | HR 80 | Temp 97.7°F

## 2024-10-23 DIAGNOSIS — H903 Sensorineural hearing loss, bilateral: Secondary | ICD-10-CM

## 2024-10-23 DIAGNOSIS — H9313 Tinnitus, bilateral: Secondary | ICD-10-CM

## 2024-10-23 NOTE — Progress Notes (Signed)
°  [address], [address] [place], [place] [address] [phone]  Audiological Evaluation    Name: Zachary Best     DOB:   Jan 23, 1956      MRN:   [id number]                                                                                     Service Date: 10/23/2024     Accompanied by: self    Patient comes today after Dr. Karis, ENT sent a referral for a hearing evaluation due to concerns with hearing loss asymmetry.   Symptoms Yes Details  Hearing loss  [x]  04-28-24: Normal to moderately severe sensorineural hearing loss from 125 Hz - 8000 Hz., bilaterally, worse in the left ear. Of note, left hearing asymmetry continues to be observed from [phone] Hz.  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 Left ear:  Clear external ear canal and notable landmarks visualized on the tympanic membrane.  Tympanometry: Right ear: Type As - Normal external ear canal volume with normal middle ear pressure and low tympanic membrane compliance. Findings are consistent with reduced eardrum mobility. Left ear: Type As - Normal external ear canal volume with normal middle ear pressure and low tympanic membrane compliance. Findings are consistent with reduced eardrum mobility.  Hearing Evaluation The hearing test results were completed under headphones and re-checked with inserts and results are deemed to be of good reliability. Test technique:  conventional    Pure tone Audiometry: Right ear- Hearing within the normal limits [phone] Hz and than mild sloping to moderately-severe  sensorineural hearing loss from 2000 Hz - 8000 Hz. Left ear-  Mild sloping to moderately-severe  sensorineural hearing loss from 250 Hz - 8000 Hz.  Speech Audiometry: Right ear- Speech Reception Threshold (SRT) was obtained at 25 dBHL. Left ear-Speech Reception Threshold (SRT) was obtained at 40 dBHL.   Word Recognition Score Tested using NU-6 (recorded) Right ear: 100% was obtained at a presentation level of 70 dBHL with contralateral masking which is deemed as  excellent. Left ear: 100% was obtained at a presentation level of 80 dBHL with contralateral masking which is deemed as  excellent.   Impression: There is a significant difference in pure-tone thresholds between ears, worse in the left ear. There is not a significant difference in the word recognition score in between ears.    Recommendations: Follow up with ENT as scheduled. Return for a hearing evaluation if concerns with hearing changes arise or per MD recommendation. Consider a communication needs assessment for amplification after medical clearance is obtained, if needed.   Tinley Rought MARIE LEROUX-MARTINEZ, AUD

## 2024-10-23 NOTE — Progress Notes (Signed)
 Patient ID: Zachary Best, male   DOB: 1956-02-17, 68 y.o.   MRN: [id number]  Follow up: Bilateral hearing loss, tinnitus  History of Present Illness Zachary Best is a 68 year old male with asymmetric bilateral sensorineural hearing loss who presents for follow-up of hearing loss and tinnitus.  He has longstanding bilateral sensorineural hearing loss, with greater severity in the left ear. He reports that his hearing has not changed much over the past six months.  He experiences persistent difficulty understanding speech, particularly in environments with significant background noise and reverberation, such as offices. These symptoms interfere with communication in both work and social settings.  He reports ongoing tinnitus, predominantly in the left ear, described as ringing that has remained unchanged and continues to be bothersome.  He previously underwent brain MRI, which showed no evidence of intracranial tumor. He does not report any new ear or neurologic symptoms over the past six months.  Exam: General: Communicates without difficulty, well nourished, no acute distress. Head: Normocephalic, no evidence injury, no tenderness, facial buttresses intact without stepoff. Face/sinus: No tenderness to palpation and percussion. Facial movement is normal and symmetric. Eyes: PERRL, EOMI. No scleral icterus, conjunctivae clear. Neuro: CN II exam reveals vision grossly intact.  No nystagmus at any point of gaze. Ears: Auricles well formed without lesions.  Ear canals are intact without mass or lesion.  No erythema or edema is appreciated.  The TMs are intact without fluid. Nose: External evaluation reveals normal support and skin without lesions.  Dorsum is intact.  Anterior rhinoscopy reveals normal mucosa over anterior aspect of inferior turbinates and intact septum.  No purulence noted. Oral:  Oral cavity and oropharynx are intact, symmetric, without erythema or edema.  Mucosa is moist without  lesions. Neck: Full range of motion without pain.  There is no significant lymphadenopathy.  No masses palpable.  Thyroid bed within normal limits to palpation.  Parotid glands and submandibular glands equal bilaterally without mass.  Trachea is midline. Neuro:  CN 2-12 grossly intact.    Assessment and Plan Assessment & Plan Bilateral high-frequency sensorineural hearing loss Chronic bilateral high-frequency sensorineural hearing loss, left greater than right. No evidence of retrocochlear pathology on prior MRI.  - Reviewed audiometry confirming stable hearing thresholds over six months. - Discussed hearing aid options, including cost-effective sources such as [employer], and advised that use is based on impact on occupational, social, or familial functioning. - Scheduled follow-up in one year. - Advised to seek earlier evaluation for sudden hearing changes.  Tinnitus Persistent left-predominant tinnitus, likely secondary to sensorineural hearing loss, without acute changes or concerning features. Hearing aids may provide symptomatic relief by increasing ambient auditory stimulation. - Discussed potential benefit of hearing aids for tinnitus suppression via auditory stimulation. - Reinforced that tinnitus is a common sequela of sensorineural hearing loss and not associated with acute pathology in his case.

## 2024-10-25 ENCOUNTER — Ambulatory Visit: Admitting: Orthopaedic Surgery

## 2024-11-07 ENCOUNTER — Encounter: Payer: Self-pay | Admitting: Audiology

## 2024-11-08 ENCOUNTER — Ambulatory Visit: Admitting: Orthopedic Surgery

## 2024-11-08 ENCOUNTER — Other Ambulatory Visit: Payer: Self-pay

## 2024-11-08 DIAGNOSIS — M25511 Pain in right shoulder: Secondary | ICD-10-CM | POA: Diagnosis not present

## 2024-11-08 DIAGNOSIS — M19011 Primary osteoarthritis, right shoulder: Secondary | ICD-10-CM | POA: Diagnosis not present

## 2024-11-08 DIAGNOSIS — G8929 Other chronic pain: Secondary | ICD-10-CM

## 2024-11-08 NOTE — Progress Notes (Unsigned)
 "  Office Visit Note   Patient: Zachary Best           Date of Birth: December 14, 1955           MRN: [id number] Visit Date: 11/08/2024 Requested by: Caro Harlene POUR, NP [address] [place]. [place],  [place] [address] PCP: Caro Harlene POUR, NP  Subjective: Chief Complaint  Patient presents with   Right Shoulder - Pain    HPI: Collins Kerby is a 68 y.o. male who presents to the office reporting right shoulder pain.  Patient has very long history of right shoulder pain.  Works in holiday [employer].  Describes chronic pain which over the past 6 months has started to wake him from sleep at night.  The subacromial injection in early December did give him about 50% relief.  Has a history of hip surgery from which he is doing well.  No prior shoulder surgery.  Uses Goody powders which helped.  He does do some heavy work in holiday representative.  Lives by himself.  May retire in about a year.  He is right-hand dominant.  Radiographs reviewed do show end-stage arthritis with some significant glenoid wear as well.  Acromiohumeral distance maintained..                ROS: All systems reviewed are negative as they relate to the chief complaint within the history of present illness.  Patient denies fevers or chills.  Assessment & Plan: Visit Diagnoses:  1. Chronic right shoulder pain     Plan: Impression is end-stage right shoulder arthritis.  Glenohumeral joint injection performed today per patient request.  He would need to wait at least 3 months from this injection before undergoing shoulder replacement.  I think shoulder replacement is likely in his future but that would potentially restrict him from some of the heavy lifting that he is used to doing.  With use of models the reverse shoulder replacement is described.  Risk and benefits discussed.  Will see him back in 3 to 4 months for possible repeat injection depending on his symptoms.  I do think he is likely heading for replacement surgery in the near future based  on his symptoms.  Follow-Up Instructions: No follow-ups on file.   Orders:  Orders Placed This Encounter  Procedures   US  Guided Needle Placement - No Linked Charges   No orders of the defined types were placed in this encounter.     Procedures: Large Joint Inj: R glenohumeral on 11/08/2024 9:45 PM Indications: diagnostic evaluation and pain Details: 22 G 3.5 in needle, ultrasound-guided posterior approach  Arthrogram: No  Medications: 9 mL bupivacaine  0.5 %; 5 mL lidocaine  1 %; 40 mg triamcinolone acetonide 40 MG/ML Outcome: tolerated well, no immediate complications Procedure, treatment alternatives, risks and benefits explained, specific risks discussed. Consent was given by the patient. Immediately prior to procedure a time out was called to verify the correct patient, procedure, equipment, support staff and site/side marked as required. Patient was prepped and draped in the usual sterile fashion.       Clinical Data: No additional findings.  Objective: Vital Signs: There were no vitals taken for this visit.  Physical Exam:  Constitutional: Patient appears well-developed HEENT:  Head: Normocephalic Eyes:EOM are normal Neck: Normal range of motion Cardiovascular: Normal rate Pulmonary/chest: Effort normal Neurologic: Patient is alert Skin: Skin is warm Psychiatric: Patient has normal mood and affect  Ortho Exam: Ortho exam demonstrates range of motion on the right 20/80/120 range of motion  of the left is 40/85/150.  He does have good rotator cuff strength infraspinatus supraspinatus and subscap muscle testing with some coarseness and popping on the right side with passive range of motion.  No masses lymphadenopathy or skin changes noted in the shoulder girdle region.  Deltoid is functional on the right-hand side.  Specialty Comments:  No specialty comments available.  Imaging: No results found.   PMFS History: Patient Active Problem List   Diagnosis Date  Noted   Sensorineural hearing loss, bilateral 04/30/2024   Tinnitus of both ears 04/30/2024   Status post total replacement of right hip 08/08/2020   Unilateral primary osteoarthritis, right hip 10/17/2019   Allergy to alpha-gal 08/18/2019   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    Allergy    seasonal   Anaphylaxis    Per [facility] New Patient Packet    Arthritis    Family history of adverse reaction to anesthesia     Family History  Problem Relation Age of Onset   Stroke Mother    Diabetes Mother        borderline   Alzheimer's disease Father    Cancer Sister     Past Surgical History:  Procedure Laterality Date   APPENDECTOMY  1966   Per [facility] New Patient Packet    TOTAL HIP ARTHROPLASTY Right 07/26/2020   Procedure: RIGHT TOTAL HIP ARTHROPLASTY ANTERIOR APPROACH;  Surgeon: Vernetta Lonni GRADE, MD;  Location: [facility];  Service: Orthopedics;  Laterality: Right;   TRANSURETHRAL RESECTION OF PROSTATE  12/26/2020   Social History   Occupational History   Not on file  Tobacco Use   Smoking status: Former    Current packs/day: 0.00    Average packs/day: 1 pack/day for 45.0 years (45.0 ttl pk-yrs)    Types: Cigarettes    Start date: 40    Quit date: 2005    Years since quitting: 21.0   Smokeless tobacco: Never  Vaping Use   Vaping status: Never Used  Substance and Sexual Activity   Alcohol use: Yes    Comment: 1-2 oz per week    Drug use: Never   Sexual activity: Not on file        "

## 2024-11-09 ENCOUNTER — Encounter: Payer: Self-pay | Admitting: Orthopedic Surgery

## 2024-11-09 MED ORDER — TRIAMCINOLONE ACETONIDE 40 MG/ML IJ SUSP
40.0000 mg | INTRAMUSCULAR | Status: AC | PRN
  Administered 2024-11-08: 40 mg via INTRA_ARTICULAR

## 2024-11-09 MED ORDER — BUPIVACAINE HCL 0.5 % IJ SOLN
9.0000 mL | INTRAMUSCULAR | Status: AC | PRN
  Administered 2024-11-08: 9 mL via INTRA_ARTICULAR

## 2024-11-09 MED ORDER — LIDOCAINE HCL 1 % IJ SOLN
5.0000 mL | INTRAMUSCULAR | Status: AC | PRN
  Administered 2024-11-08: 5 mL

## 2025-01-12 ENCOUNTER — Ambulatory Visit: Payer: Self-pay | Admitting: Nurse Practitioner

## 2025-07-12 ENCOUNTER — Ambulatory Visit: Payer: Self-pay | Admitting: Nurse Practitioner
